# Patient Record
Sex: Male | Born: 1955 | Race: Black or African American | Hispanic: No | Smoking: Never smoker
Health system: Southern US, Community
[De-identification: ages and names within clinical notes are randomized; demographics above are authoritative.]

## PROBLEM LIST (undated history)

## (undated) DIAGNOSIS — I10 Essential (primary) hypertension: Secondary | ICD-10-CM

## (undated) DIAGNOSIS — W3400XA Accidental discharge from unspecified firearms or gun, initial encounter: Secondary | ICD-10-CM

## (undated) DIAGNOSIS — S21239A Puncture wound without foreign body of unspecified back wall of thorax without penetration into thoracic cavity, initial encounter: Secondary | ICD-10-CM

## (undated) DIAGNOSIS — M069 Rheumatoid arthritis, unspecified: Secondary | ICD-10-CM

## (undated) HISTORY — PX: COLONOSCOPY: SHX174

---
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
## 1978-10-05 HISTORY — PX: NEUROPLASTY / TRANSPOSITION ULNAR NERVE AT ELBOW: SUR895

## 2000-06-06 ENCOUNTER — Emergency Department (HOSPITAL_COMMUNITY): Admission: EM | Admit: 2000-06-06 | Discharge: 2000-06-06 | Payer: Self-pay | Admitting: Emergency Medicine

## 2000-06-06 ENCOUNTER — Encounter: Payer: Self-pay | Admitting: Emergency Medicine

## 2000-06-15 ENCOUNTER — Encounter: Admission: RE | Admit: 2000-06-15 | Discharge: 2000-06-15 | Payer: Self-pay | Admitting: Hematology and Oncology

## 2000-06-21 ENCOUNTER — Encounter: Payer: Self-pay | Admitting: Internal Medicine

## 2000-06-21 ENCOUNTER — Ambulatory Visit (HOSPITAL_COMMUNITY): Admission: RE | Admit: 2000-06-21 | Discharge: 2000-06-21 | Payer: Self-pay | Admitting: Internal Medicine

## 2000-06-28 ENCOUNTER — Encounter: Admission: RE | Admit: 2000-06-28 | Discharge: 2000-06-28 | Payer: Self-pay | Admitting: Internal Medicine

## 2003-10-06 HISTORY — PX: DEBRIDEMENT LEG: SUR390

## 2007-01-22 ENCOUNTER — Inpatient Hospital Stay (HOSPITAL_COMMUNITY): Admission: AC | Admit: 2007-01-22 | Discharge: 2007-01-24 | Payer: Self-pay

## 2007-03-31 ENCOUNTER — Ambulatory Visit (HOSPITAL_COMMUNITY): Admission: RE | Admit: 2007-03-31 | Discharge: 2007-03-31 | Payer: Self-pay | Admitting: Orthopedic Surgery

## 2008-05-29 ENCOUNTER — Emergency Department (HOSPITAL_COMMUNITY): Admission: EM | Admit: 2008-05-29 | Discharge: 2008-05-29 | Payer: Self-pay | Admitting: Emergency Medicine

## 2011-04-10 ENCOUNTER — Emergency Department (HOSPITAL_COMMUNITY)
Admission: EM | Admit: 2011-04-10 | Discharge: 2011-04-10 | Disposition: A | Discharge status: Home / Self Care | Payer: Self-pay | Attending: Emergency Medicine | Admitting: Emergency Medicine

## 2011-04-10 DIAGNOSIS — R21 Rash and other nonspecific skin eruption: Secondary | ICD-10-CM | POA: Insufficient documentation

## 2011-04-10 DIAGNOSIS — L299 Pruritus, unspecified: Secondary | ICD-10-CM | POA: Insufficient documentation

## 2011-04-20 ENCOUNTER — Emergency Department (HOSPITAL_COMMUNITY)
Admission: EM | Admit: 2011-04-20 | Discharge: 2011-04-20 | Disposition: A | Discharge status: Home / Self Care | Payer: Self-pay | Attending: Emergency Medicine | Admitting: Emergency Medicine

## 2011-04-20 DIAGNOSIS — L298 Other pruritus: Secondary | ICD-10-CM | POA: Insufficient documentation

## 2011-04-20 DIAGNOSIS — R21 Rash and other nonspecific skin eruption: Secondary | ICD-10-CM | POA: Insufficient documentation

## 2011-04-20 DIAGNOSIS — L2989 Other pruritus: Secondary | ICD-10-CM | POA: Insufficient documentation

## 2011-05-14 ENCOUNTER — Emergency Department (HOSPITAL_COMMUNITY): Payer: Self-pay

## 2011-05-14 ENCOUNTER — Inpatient Hospital Stay (HOSPITAL_COMMUNITY)
Admission: EM | Admit: 2011-05-14 | Discharge: 2011-05-18 | DRG: 501 | Disposition: A | Discharge status: Home / Self Care | Payer: Self-pay | Attending: Internal Medicine | Admitting: Internal Medicine

## 2011-05-14 DIAGNOSIS — M67919 Unspecified disorder of synovium and tendon, unspecified shoulder: Principal | ICD-10-CM | POA: Diagnosis present

## 2011-05-14 DIAGNOSIS — IMO0002 Reserved for concepts with insufficient information to code with codable children: Secondary | ICD-10-CM | POA: Diagnosis present

## 2011-05-14 DIAGNOSIS — I10 Essential (primary) hypertension: Secondary | ICD-10-CM | POA: Diagnosis present

## 2011-05-14 DIAGNOSIS — M719 Bursopathy, unspecified: Principal | ICD-10-CM | POA: Diagnosis present

## 2011-05-14 DIAGNOSIS — K029 Dental caries, unspecified: Secondary | ICD-10-CM | POA: Diagnosis present

## 2011-05-14 DIAGNOSIS — D649 Anemia, unspecified: Secondary | ICD-10-CM | POA: Diagnosis not present

## 2011-05-14 DIAGNOSIS — M24619 Ankylosis, unspecified shoulder: Secondary | ICD-10-CM | POA: Diagnosis present

## 2011-05-14 DIAGNOSIS — A4901 Methicillin susceptible Staphylococcus aureus infection, unspecified site: Secondary | ICD-10-CM | POA: Diagnosis present

## 2011-05-14 LAB — DIFFERENTIAL
Basophils Relative: 1 % (ref 0–1)
Monocytes Absolute: 0.6 10*3/uL (ref 0.1–1.0)
Monocytes Relative: 8 % (ref 3–12)
Neutro Abs: 4.8 10*3/uL (ref 1.7–7.7)

## 2011-05-14 LAB — CBC
HCT: 38.9 % — ABNORMAL LOW (ref 39.0–52.0)
Hemoglobin: 12.4 g/dL — ABNORMAL LOW (ref 13.0–17.0)
MCH: 29 pg (ref 26.0–34.0)
MCHC: 31.9 g/dL (ref 30.0–36.0)

## 2011-05-14 LAB — SEDIMENTATION RATE: Sed Rate: 40 mm/hr — ABNORMAL HIGH (ref 0–16)

## 2011-05-14 LAB — POCT I-STAT, CHEM 8
BUN: 8 mg/dL (ref 6–23)
Creatinine, Ser: 0.9 mg/dL (ref 0.50–1.35)
Sodium: 141 mEq/L (ref 135–145)
TCO2: 28 mmol/L (ref 0–100)

## 2011-05-15 ENCOUNTER — Observation Stay (HOSPITAL_COMMUNITY): Payer: Self-pay

## 2011-05-15 LAB — COMPREHENSIVE METABOLIC PANEL
Albumin: 2.4 g/dL — ABNORMAL LOW (ref 3.5–5.2)
BUN: 8 mg/dL (ref 6–23)
Calcium: 8.1 mg/dL — ABNORMAL LOW (ref 8.4–10.5)
Creatinine, Ser: 0.76 mg/dL (ref 0.50–1.35)
Total Protein: 6 g/dL (ref 6.0–8.3)

## 2011-05-15 LAB — CBC
HCT: 36.5 % — ABNORMAL LOW (ref 39.0–52.0)
MCH: 29.4 pg (ref 26.0–34.0)
MCHC: 32.3 g/dL (ref 30.0–36.0)
MCV: 91 fL (ref 78.0–100.0)
RDW: 12.6 % (ref 11.5–15.5)

## 2011-05-15 MED ORDER — GADOBENATE DIMEGLUMINE 529 MG/ML IV SOLN
15.0000 mL | Freq: Once | INTRAVENOUS | Status: AC
  Administered 2011-05-15: 15 mL via INTRAVENOUS

## 2011-05-16 LAB — BASIC METABOLIC PANEL
Calcium: 8.3 mg/dL — ABNORMAL LOW (ref 8.4–10.5)
Creatinine, Ser: 0.87 mg/dL (ref 0.50–1.35)
GFR calc non Af Amer: >60 mL/min (ref >60)
Glucose, Bld: 139 mg/dL — ABNORMAL HIGH (ref 70–99)
Sodium: 137 mEq/L (ref 135–145)

## 2011-05-16 LAB — CBC
MCH: 29.9 pg (ref 26.0–34.0)
MCHC: 32.9 g/dL (ref 30.0–36.0)
MCV: 90.8 fL (ref 78.0–100.0)
Platelets: 310 10*3/uL (ref 150–400)

## 2011-05-17 LAB — BASIC METABOLIC PANEL
CO2: 27 mEq/L (ref 19–32)
Calcium: 8.7 mg/dL (ref 8.4–10.5)
Chloride: 105 mEq/L (ref 96–112)
Glucose, Bld: 84 mg/dL (ref 70–99)
Sodium: 139 mEq/L (ref 135–145)

## 2011-05-18 LAB — BODY FLUID CULTURE
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
## 2011-05-21 LAB — CULTURE, BLOOD (ROUTINE X 2)
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
## 2017-03-10 ENCOUNTER — Encounter (HOSPITAL_COMMUNITY): Payer: Self-pay | Admitting: Emergency Medicine

## 2017-03-10 ENCOUNTER — Emergency Department (HOSPITAL_COMMUNITY)
Admission: EM | Admit: 2017-03-10 | Discharge: 2017-03-10 | Disposition: A | Discharge status: Home / Self Care | Payer: Self-pay | Attending: Emergency Medicine | Admitting: Emergency Medicine

## 2017-03-10 DIAGNOSIS — L509 Urticaria, unspecified: Secondary | ICD-10-CM | POA: Insufficient documentation

## 2017-03-10 DIAGNOSIS — T7840XA Allergy, unspecified, initial encounter: Secondary | ICD-10-CM | POA: Insufficient documentation

## 2017-03-10 MED ORDER — SODIUM CHLORIDE 0.9 % IV SOLN
Freq: Once | INTRAVENOUS | Status: AC
  Administered 2017-03-10: 07:00:00 via INTRAVENOUS

## 2017-03-10 MED ORDER — FAMOTIDINE IN NACL 20-0.9 MG/50ML-% IV SOLN
20.0000 mg | Freq: Once | INTRAVENOUS | Status: AC
  Administered 2017-03-10: 20 mg via INTRAVENOUS
  Filled 2017-03-10: qty 50

## 2017-03-10 MED ORDER — DIPHENHYDRAMINE HCL 25 MG PO CAPS: 25.0000 mg | ORAL_CAPSULE | Freq: Four times a day (QID) | ORAL | 0 refills | Status: DC | PRN

## 2017-03-10 MED ORDER — DIPHENHYDRAMINE HCL 50 MG/ML IJ SOLN
25.0000 mg | Freq: Once | INTRAMUSCULAR | Status: AC
Start: 2017-03-10 — End: 2017-03-10
  Administered 2017-03-10: 25 mg via INTRAVENOUS
  Filled 2017-03-10: qty 1

## 2017-03-10 MED ORDER — METHYLPREDNISOLONE SODIUM SUCC 125 MG IJ SOLR
125.0000 mg | Freq: Once | INTRAMUSCULAR | Status: AC
Start: 2017-03-10 — End: 2017-03-10
  Administered 2017-03-10: 125 mg via INTRAVENOUS
  Filled 2017-03-10: qty 2

## 2017-03-10 NOTE — ED Provider Notes (Signed)
[facility] Provider Note   CSN: [id number] Arrival date & time: 03/10/17  0636     History   Chief Complaint Chief Complaint  Patient presents with  . Allergic Reaction    HPI Jimmy Peters is a 61 y.o. male.  HPI 60 y.o. male, presents to the Emergency Department today due to rash and itching last night. Pt states that he was out gardening around 7pm in the leaves when he started to have hives develop. Notes rash on BUE, Torso, BLE spared. Denies throat closure. No difficulty breathing. No pain currently. No fevers. No URI symptoms. No CP/SOB/ABD pain. No headaches. Did not take anything PTA. No meds yesterday. No other symptoms noted.   No past medical history on file.  There are no active problems to display for this patient.   No past surgical history on file.     Home Medications    Prior to Admission medications   Not on File    Family History No family history on file.  Social History Social History  Substance Use Topics  . Smoking status: Not on file  . Smokeless tobacco: Not on file  . Alcohol use Not on file     Allergies   Patient has no allergy information on record.   Review of Systems Review of Systems ROS reviewed and all are negative for acute change except as noted in the HPI.  Physical Exam Updated Vital Signs BP 135/86   Pulse (!) 43   Temp 97.5 F (36.4 C) (Oral)   Resp 16   Ht 5\' 8"  (1.727 m)   Wt 70.8 kg (156 lb)   SpO2 99%   BMI 23.72 kg/m   Physical Exam  Constitutional: He is oriented to person, place, and time. Vital signs are normal. He appears well-developed and well-nourished.  HENT:  Head: Normocephalic and atraumatic.  Right Ear: Hearing normal.  Left Ear: Hearing normal.  Pt phonating well. No sign of airway compromise   Eyes: Conjunctivae and EOM are normal. Pupils are equal, round, and reactive to light.  Neck: Normal range of motion. Neck supple.  Cardiovascular: Normal rate, regular rhythm,  normal heart sounds and intact distal pulses.   Pulmonary/Chest: Effort normal and breath sounds normal.  Abdominal: There is no tenderness.  Neurological: He is alert and oriented to person, place, and time.  Skin: Skin is warm and dry.  Diffuse urticarial rash noted BUE. Hives noted. No signs of infection.   Psychiatric: He has a normal mood and affect. His speech is normal and behavior is normal. Thought content normal.  Nursing note and vitals reviewed.  ED Treatments / Results  Labs (all labs ordered are listed, but only abnormal results are displayed) Labs Reviewed - No data to display  EKG  EKG Interpretation None       Radiology No results found.  Procedures Procedures (including critical care time)  Medications Ordered in ED Medications  methylPREDNISolone sodium succinate (SOLU-MEDROL) 125 mg/2 mL injection 125 mg (125 mg Intravenous Given 03/10/17 0657)  diphenhydrAMINE (BENADRYL) injection 25 mg (25 mg Intravenous Given 03/10/17 0656)  famotidine (PEPCID) IVPB 20 mg premix (0 mg Intravenous Stopped 03/10/17 0730)  0.9 %  sodium chloride infusion ( Intravenous New Bag/Given 03/10/17 05/10/17)     Initial Impression / Assessment and Plan / ED Course  I have reviewed the triage vital signs and the nursing notes.  Pertinent labs & imaging results that were available during my care of the patient were  reviewed by me and considered in my medical decision making (see chart for details).  Final Clinical Impressions(s) / ED Diagnoses     {I have reviewed the relevant previous healthcare records.  {I obtained HPI from historian.   ED Course:  Assessment: Pt is a 61 y.o. male who presents with allergic reaction last night. Diffuse urticarial rash wit hives noted. No anaphylaxis. No airway compromise. On exam, pt in NAD. Nontoxic/nonseptic appearing. VSS. Afebrile. Lungs CTA. Heart RRR. Abdomen nontender soft. Hives noted BUE. No airway compromise. Posterior oropharynx  unremarkable.. Given solumedrol, benadryl, famotidine with significant relief of symptoms in ED. Plan is to DC home with [place] follow up with PCP. At time of discharge, Patient is in no acute distress. Vital Signs are stable. Patient is able to ambulate. Patient able to tolerate PO.   Disposition/Plan:  DC Home Additional Verbal discharge instructions given and discussed with patient.  Pt Instructed to f/u with PCP in the next week for evaluation and treatment of symptoms. Return precautions given Pt acknowledges and agrees with plan  Supervising Physician Gilda Crease, *  Final diagnoses:  Allergic reaction, initial encounter  Hives    New Prescriptions New Prescriptions   No medications on file     Audry Pili, Cordelia Poche 03/10/17 9675    Gilda Crease, MD 03/11/17 [phone]

## 2017-03-10 NOTE — Discharge Instructions (Signed)
Please read and follow all provided instructions.  Your diagnoses today include:  1. Allergic reaction, initial encounter   2. Hives    Tests performed today include: Vital signs. See below for your results today.   Medications prescribed:  Take as prescribed   Home care instructions:  Follow any educational materials contained in this packet.  Follow-up instructions: Please follow-up with your primary care provider for further evaluation of symptoms and treatment   Return instructions:  Please return to the Emergency Department if you do not get better, if you get worse, or new symptoms OR  - Fever (temperature greater than 101.33F)  - Bleeding that does not stop with holding pressure to the area    -Severe pain (please note that you may be more sore the day after your accident)  - Chest Pain  - Difficulty breathing  - Severe nausea or vomiting  - Inability to tolerate food and liquids  - Passing out  - Skin becoming red around your wounds  - Change in mental status (confusion or lethargy)  - New numbness or weakness    Please return if you have any other emergent concerns.  Additional Information:  Your vital signs today were: BP 135/86    Pulse (!) 43    Temp 97.5 F (36.4 C) (Oral)    Resp 16    Ht 5\' 8"  (1.727 m)    Wt 70.8 kg (156 lb)    SpO2 99%    BMI 23.72 kg/m  If your blood pressure (BP) was elevated above 135/85 this visit, please have this repeated by your doctor within one month. ---------------

## 2017-03-10 NOTE — ED Triage Notes (Signed)
Patient states he was working in leaves in the yard last night, started having itching and burning last night.  Took a shower, but still having the symptoms.  Patient now with hives.  Patient denies any dyspnea.

## 2017-03-11 ENCOUNTER — Encounter (HOSPITAL_COMMUNITY): Payer: Self-pay | Admitting: Emergency Medicine

## 2017-03-11 ENCOUNTER — Emergency Department (HOSPITAL_COMMUNITY)
Admission: EM | Admit: 2017-03-11 | Discharge: 2017-03-11 | Disposition: A | Discharge status: Home / Self Care | Payer: Self-pay | Attending: Emergency Medicine | Admitting: Emergency Medicine

## 2017-03-11 DIAGNOSIS — L509 Urticaria, unspecified: Secondary | ICD-10-CM | POA: Insufficient documentation

## 2017-03-11 MED ORDER — PREDNISONE 20 MG PO TABS: ORAL_TABLET | ORAL | 0 refills | Status: DC

## 2017-03-11 MED ORDER — DIPHENHYDRAMINE HCL 50 MG/ML IJ SOLN
50.0000 mg | Freq: Once | INTRAMUSCULAR | Status: AC
  Administered 2017-03-11: 50 mg via INTRAVENOUS
  Filled 2017-03-11: qty 1

## 2017-03-11 MED ORDER — FAMOTIDINE IN NACL 20-0.9 MG/50ML-% IV SOLN
20.0000 mg | Freq: Once | INTRAVENOUS | Status: AC
  Administered 2017-03-11: 20 mg via INTRAVENOUS
  Filled 2017-03-11: qty 50

## 2017-03-11 MED ORDER — METHYLPREDNISOLONE SODIUM SUCC 125 MG IJ SOLR
125.0000 mg | Freq: Once | INTRAMUSCULAR | Status: AC
  Administered 2017-03-11: 125 mg via INTRAVENOUS
  Filled 2017-03-11: qty 2

## 2017-03-11 NOTE — ED Provider Notes (Signed)
[facility] Provider Note   CSN: [id number] Arrival date & time: 03/11/17  0127     History   Chief Complaint Chief Complaint  Patient presents with  . Urticaria    HPI Jimmy Peters is a 61 y.o. male.  Patient presents to the emergency department for evaluation of hives and itching. Patient was seen in the ER yesterday for similar. He reports that he was given medications in the ER that resolved the hives, but after going home hives came back. He took over-the-counter Benadryl without relief. Patient reports diffuse itching. He denies tongue swelling, throat swelling, difficulty swallowing, shortness of breath. He is not aware of what caused the reaction, has never had a similar reaction.      History reviewed. No pertinent past medical history.  There are no active problems to display for this patient.   History reviewed. No pertinent surgical history.     Home Medications    Prior to Admission medications   Medication Sig Start Date End Date Taking? Authorizing Provider  diphenhydrAMINE (BENADRYL) 25 mg capsule Take 1 capsule (25 mg total) by mouth every 6 (six) hours as needed. 03/10/17   Audry Pili, PA-C    Family History No family history on file.  Social History Social History  Substance Use Topics  . Smoking status: Never Smoker  . Smokeless tobacco: Never Used  . Alcohol use No     Allergies   Patient has no known allergies.   Review of Systems Review of Systems  Skin: Positive for rash.  All other systems reviewed and are negative.    Physical Exam Updated Vital Signs BP (!) 143/95 (BP Location: Left Arm)   Pulse 60   Temp 98.2 F (36.8 C) (Oral)   Resp 18   SpO2 99%   Physical Exam  Constitutional: He is oriented to person, place, and time. He appears well-developed and well-nourished. No distress.  HENT:  Head: Normocephalic and atraumatic.  Right Ear: Hearing normal.  Left Ear: Hearing normal.  Nose: Nose normal.    Mouth/Throat: Oropharynx is clear and moist and mucous membranes are normal.  Eyes: Conjunctivae and EOM are normal. Pupils are equal, round, and reactive to light.  Neck: Normal range of motion. Neck supple.  Cardiovascular: Regular rhythm, S1 normal and S2 normal.  Exam reveals no gallop and no friction rub.   No murmur heard. Pulmonary/Chest: Effort normal and breath sounds normal. No respiratory distress. He exhibits no tenderness.  Abdominal: Soft. Normal appearance and bowel sounds are normal. There is no hepatosplenomegaly. There is no tenderness. There is no rebound, no guarding, no tenderness at McBurney's point and negative Murphy's sign. No hernia.  Musculoskeletal: Normal range of motion.  Neurological: He is alert and oriented to person, place, and time. He has normal strength. No cranial nerve deficit or sensory deficit. Coordination normal. GCS eye subscore is 4. GCS verbal subscore is 5. GCS motor subscore is 6.  Skin: Skin is warm, dry and intact. Rash (diffuse urticaria) noted. No cyanosis.  Psychiatric: He has a normal mood and affect. His speech is normal and behavior is normal. Thought content normal.  Nursing note and vitals reviewed.    ED Treatments / Results  Labs (all labs ordered are listed, but only abnormal results are displayed) Labs Reviewed - No data to display  EKG  EKG Interpretation None       Radiology No results found.  Procedures Procedures (including critical care time)  Medications Ordered in ED  Medications  famotidine (PEPCID) IVPB 20 mg premix (20 mg Intravenous New Bag/Given 03/11/17 0526)  diphenhydrAMINE (BENADRYL) injection 50 mg (50 mg Intravenous Given 03/11/17 0515)  methylPREDNISolone sodium succinate (SOLU-MEDROL) 125 mg/2 mL injection 125 mg (125 mg Intravenous Given 03/11/17 0518)     Initial Impression / Assessment and Plan / ED Course  I have reviewed the triage vital signs and the nursing notes.  Pertinent labs & imaging  results that were available during my care of the patient were reviewed by me and considered in my medical decision making (see chart for details).     Patient presents with recurrent hives. Patient treated in the ER with improvement yesterday but has had recurrence of the hives without signs of systemic allergic response or anaphylaxis. Patient treated with Benadryl, Pepcid, Solu-Medrol here in the ER and will continue prednisone taper and antihistamines at home.  Final Clinical Impressions(s) / ED Diagnoses   Final diagnoses:  Hives    New Prescriptions New Prescriptions   No medications on file     Gilda Crease, MD 03/11/17 [phone]

## 2017-03-11 NOTE — ED Triage Notes (Signed)
Pt to ED for itching and hives, was seen Wednesday morning for exact same and given benadryl as well as a prescription for it, which he states he couldn't get filled because he was working. Pt does state that he tried OTC benadryl, which didn't do anything for him. Last time he took it was about 6 hours ago. Pt reports he doesn't know what he's allergic to - states nothing. He also reports that the hives went away after treated in the ED, but they came right back. Pt denies any other symptoms. Resp e/u.

## 2017-03-11 NOTE — Discharge Instructions (Signed)
Take benadryl 2 tablets every 6 hours for 2 days.

## 2019-01-28 ENCOUNTER — Other Ambulatory Visit: Payer: Self-pay

## 2019-01-28 ENCOUNTER — Emergency Department (HOSPITAL_COMMUNITY): Payer: Self-pay

## 2019-01-28 ENCOUNTER — Encounter (HOSPITAL_COMMUNITY): Payer: Self-pay | Admitting: Emergency Medicine

## 2019-01-28 ENCOUNTER — Emergency Department (HOSPITAL_COMMUNITY)
Admission: EM | Admit: 2019-01-28 | Discharge: 2019-01-28 | Disposition: A | Discharge status: Home / Self Care | Payer: Self-pay | Attending: Emergency Medicine | Admitting: Emergency Medicine

## 2019-01-28 DIAGNOSIS — R519 Headache, unspecified: Secondary | ICD-10-CM

## 2019-01-28 DIAGNOSIS — I10 Essential (primary) hypertension: Secondary | ICD-10-CM | POA: Insufficient documentation

## 2019-01-28 DIAGNOSIS — R51 Headache: Secondary | ICD-10-CM | POA: Insufficient documentation

## 2019-01-28 HISTORY — DX: Essential (primary) hypertension: I10

## 2019-01-28 LAB — CBC
HCT: 44.9 % (ref 39.0–52.0)
Hemoglobin: 14.3 g/dL (ref 13.0–17.0)
MCH: 30.4 pg (ref 26.0–34.0)
MCHC: 31.8 g/dL (ref 30.0–36.0)
MCV: 95.3 fL (ref 80.0–100.0)
Platelets: 240 10*3/uL (ref 150–400)
RBC: 4.71 MIL/uL (ref 4.22–5.81)
RDW: 12 % (ref 11.5–15.5)
WBC: 6.2 10*3/uL (ref 4.0–10.5)
nRBC: 0 % (ref 0.0–0.2)

## 2019-01-28 LAB — BASIC METABOLIC PANEL
Anion gap: 9 (ref 5–15)
BUN: 13 mg/dL (ref 6–20)
CO2: 27 mmol/L (ref 22–32)
Calcium: 9.1 mg/dL (ref 8.9–10.3)
Chloride: 104 mmol/L (ref 98–111)
Creatinine, Ser: 0.9 mg/dL (ref 0.61–1.24)
GFR calc Af Amer: >60 mL/min (ref >60)
GFR calc non Af Amer: >60 mL/min (ref >60)
Glucose, Bld: 107 mg/dL — ABNORMAL HIGH (ref 70–99)
Potassium: 3.9 mmol/L (ref 3.5–5.1)
Sodium: 140 mmol/L (ref 135–145)

## 2019-01-28 MED ORDER — METOCLOPRAMIDE HCL 5 MG/ML IJ SOLN
10.0000 mg | Freq: Once | INTRAMUSCULAR | Status: AC
  Administered 2019-01-28: 22:00:00 10 mg via INTRAVENOUS
  Filled 2019-01-28: qty 2

## 2019-01-28 MED ORDER — KETOROLAC TROMETHAMINE 15 MG/ML IJ SOLN
15.0000 mg | Freq: Once | INTRAMUSCULAR | Status: AC
  Administered 2019-01-28: 22:00:00 15 mg via INTRAVENOUS
  Filled 2019-01-28: qty 1

## 2019-01-28 NOTE — Discharge Instructions (Addendum)
You have been seen today for a headache. Please read and follow all provided instructions.   1. Medications: continue your usual home medications 2. Treatment: rest, drink plenty of fluids 3. Follow Up: Please follow up with your primary doctor in 2 days for discussion of your diagnoses and further evaluation after today's visit; if you do not have a primary care doctor use the resource guide provided to find one; Please return to the ER for any new or worsening symptoms. Please obtain all of your results from medical records or have your doctors office obtain the results - share them with your doctor - you should be seen at your doctors office. Call today to arrange your follow up.   Take medications as prescribed. Please review all of the medicines and only take them if you do not have an allergy to them. Return to the emergency room for worsening condition or new concerning symptoms. Follow up with your regular doctor. If you don't have a regular doctor use one of the numbers below to establish a primary care doctor.  It is also a possibility that you have an allergic reaction to any of the medicines that you have been prescribed - Everybody reacts differently to medications and while MOST people have no trouble with most medicines, you may have a reaction such as nausea, vomiting, rash, swelling, shortness of breath. If this is the case, please stop taking the medicine immediately and contact your physician.  ?  You should return to the ER if you develop severe or worsening symptoms.   Emergency Department Resource Guide 1) Find a Doctor and Pay Out of Pocket Although you won't have to find out who is covered by your insurance plan, it is a good idea to ask around and get recommendations. You will then need to call the office and see if the doctor you have chosen will accept you as a new patient and what types of options they offer for patients who are self-pay. Some doctors offer discounts or will  set up payment plans for their patients who do not have insurance, but you will need to ask so you aren't surprised when you get to your appointment.  2) Contact Your Local Health Department Not all health departments have doctors that can see patients for sick visits, but many do, so it is worth a call to see if yours does. If you don't know where your local health department is, you can check in your phone book. The CDC also has a tool to help you locate your state's health department, and many state websites also have listings of all of their local health departments.  3) Find a Walk-in Clinic If your illness is not likely to be very severe or complicated, you may want to try a walk in clinic. These are popping up all over the country in pharmacies, drugstores, and shopping centers. They're usually staffed by nurse practitioners or physician assistants that have been trained to treat common illnesses and complaints. They're usually fairly quick and inexpensive. However, if you have serious medical issues or chronic medical problems, these are probably not your best option.  No Primary Care Doctor: Call Health Connect at  [phone] - they can help you locate a primary care doctor that  accepts your insurance, provides certain services, etc. Physician Referral Service- [phone]  Chronic Pain Problems: Organization         Address  Phone   Notes  [facility]  (  [phone] Patients need to be referred by their primary care doctor.   Medication Assistance: Organization         Address  Phone   Notes  [facility] Medication [facility] [address] [place], [place] [address] [phone] --Must be a resident of [facility] -- Must have NO insurance coverage whatsoever (no Medicaid/ Medicare, etc.) -- The pt. MUST have a primary care doctor that directs their care regularly and follows them in the community   [facility]  [phone]    [employer]  [phone]    Agencies that provide inexpensive medical care: Organization         Address  Phone   Notes  [place]  [phone]   [facility]    [phone]   [facility] [place], [place] [address] [phone]   [place] [address] [place] [phone]   [facility]    [phone]   [facility]    [phone]   [place] and [place] [address], [place] Phone:  [phone], Fax:  [phone] Hours of Operation:  9 am - 6 pm, M-F.  Also accepts Medicaid/Medicare and self-pay.  [facility] for [place] [address], [address], [place] Phone: [phone], Fax: [phone]. Hours of Operation:  8:30 am - 5:30 pm, M-F.  Also accepts Medicaid and self-pay.  [facility] [place] [address], [place] Phone: [phone]   [place], [place], [place] [phone], Ext. [phone] Mondays & Thursdays: 7-9 AM.  First 15 patients are seen on a first come, first serve basis.    [place] Providers:  Organization         Address  Phone   Notes  [facility] [address], [place] [phone] Also accepts self-pay patients.  [facility] [address], [place]  [phone]   [place], [address], [place] [phone]   [facility] [address] [place] [phone]   Lucianne Lei [address], [place]   [phone] Only accepts [place] [facility] [place] patients after they have their name applied to their card.   Self-Pay (no insurance) in [facility]:  Organization         Address  Phone   Notes  Sickle Cell Patients, [facility] [place] [phone]   [facility] [place] [phone]   [facility] [place]  [place], [place],  [phone]   [facility]/Dr. Osei-Bonsu  [address] [place] or [address], [place] [phone] Phone number for both [place] and [place] locations is the same.  [facility] [address] [place] [phone]   [facility] [address] [place] or [address] [phone]   [facility] [address] [place] [phone], phone; [phone], fax Sees patients 1st and 3rd Saturday of every month.  Must not qualify for public or private insurance (i.e. Medicaid, Medicare, [place] Health Choice, Veterans' Benefits)  Household income should be no more than 200% of the poverty level The clinic cannot treat you if you are pregnant or think you are pregnant  Sexually transmitted diseases are not treated at the clinic.

## 2019-01-28 NOTE — ED Triage Notes (Addendum)
Pt. BIB EMS escorted by detention officer from [facility]al facility c/o HTN, Headache of 10 on scale 0-10 described as "throbbing", and  left sided weakness and numbness starting yesterday at 4 am. Pt. C/o blurry vision yesterday but none present. Pt A&O x4.  HX. Of HTN   EMS VS BP 181/96 HR 68 SpO2 96% RA

## 2019-01-28 NOTE — ED Provider Notes (Addendum)
[facility] EMERGENCY DEPARTMENT Provider Note   CSN: [id number] Arrival date & time: 01/28/19  2115    History   Chief Complaint No chief complaint on file. Headache, weakness  HPI Jimmy Peters is a 63 y.o. male with a PMH of HTN presenting with a constant right sided throbbing headache onset yesterday. Patient arrived via EMS by detention officer from correctional facility. Patient reports left sided upper extremity weakness and numbness onset 4:30am today. Patient states nothing makes symptoms better or worse. Patient denies taking any medications for his symptoms. Patient reports blurry vision at 4pm today, but states symptoms have resolved. Patient states he takes HCTZ for his blood pressure and denies missing any doses. Patient denies fever, cough, congestion, nausea, vomiting, abdominal pain, chest pain, or shortness of breath. Patient denies sick contacts, known exposure to COVID-19, or recent travel.      HPI  Past Medical History:  Diagnosis Date  . Hypertension     There are no active problems to display for this patient.   History reviewed. No pertinent surgical history.      Home Medications    Prior to Admission medications   Medication Sig Start Date End Date Taking? Authorizing Provider  diphenhydrAMINE (BENADRYL) 25 mg capsule Take 1 capsule (25 mg total) by mouth every 6 (six) hours as needed. 03/10/17   Audry Pili, PA-C  predniSONE (DELTASONE) 20 MG tablet 3 tabs po daily x 3 days, then 2 tabs x 3 days, then 1.5 tabs x 3 days, then 1 tab x 3 days, then 0.5 tabs x 3 days 03/11/17   Gilda Crease, MD    Family History No family history on file.  Social History Social History   Tobacco Use  . Smoking status: Never Smoker  . Smokeless tobacco: Never Used  Substance Use Topics  . Alcohol use: No  . Drug use: Yes     Allergies   Patient has no known allergies.   Review of Systems Review of Systems  Constitutional:  Negative for chills, diaphoresis and fever.  Eyes: Positive for visual disturbance.  Respiratory: Negative for cough and shortness of breath.   Cardiovascular: Negative for chest pain.  Gastrointestinal: Negative for abdominal pain, nausea and vomiting.  Endocrine: Negative for cold intolerance and heat intolerance.  Musculoskeletal: Negative for gait problem, neck pain and neck stiffness.  Skin: Negative for rash.  Allergic/Immunologic: Negative for immunocompromised state.  Neurological: Positive for weakness, numbness and headaches. Negative for dizziness and light-headedness.  Hematological: Negative for adenopathy.    Physical Exam Updated Vital Signs BP (!) 153/94 (BP Location: Right Arm)   Pulse (!) 55   Temp 98.2 F (36.8 C) (Oral)   Resp (!) 24   Ht 5\' 8"  (1.727 m)   Wt 73.9 kg   SpO2 99%   BMI 24.78 kg/m   Physical Exam Vitals signs and nursing note reviewed.  Constitutional:      General: He is not in acute distress.    Appearance: He is well-developed. He is not diaphoretic.  HENT:     Head: Normocephalic and atraumatic.     Nose: Nose normal. No congestion or rhinorrhea.     Mouth/Throat:     Mouth: Mucous membranes are moist.     Pharynx: No posterior oropharyngeal erythema.  Eyes:     Extraocular Movements: Extraocular movements intact.     Conjunctiva/sclera: Conjunctivae normal.     Pupils: Pupils are equal, round, and reactive to light.  Neck:     Musculoskeletal: Normal range of motion.  Cardiovascular:     Rate and Rhythm: Normal rate and regular rhythm.     Heart sounds: Normal heart sounds. No murmur. No friction rub. No gallop.   Pulmonary:     Effort: Pulmonary effort is normal. No respiratory distress.     Breath sounds: Normal breath sounds. No wheezing or rales.  Abdominal:     Palpations: Abdomen is soft.     Tenderness: There is no abdominal tenderness.  Musculoskeletal: Normal range of motion.  Skin:    General: Skin is warm.      Findings: No erythema or rash.  Neurological:     Mental Status: He is alert.    Mental Status:  Alert, oriented, thought content appropriate, able to give a coherent history. Speech fluent without evidence of aphasia. Able to follow 2 step commands without difficulty.  Cranial Nerves:  II:  Peripheral visual fields grossly normal, pupils equal, round, reactive to light III,IV, VI: ptosis not present, extra-ocular motions intact bilaterally  V,VII: smile symmetric, facial light touch sensation equal VIII: hearing grossly normal to voice  IX,X: symmetric elevation of soft palate, uvula elevates symmetrically  XI: bilateral shoulder shrug symmetric and strong XII: midline tongue extension without fassiculations Motor:  Normal tone. 5/5 in upper and lower extremities bilaterally including strong and equal grip strength and dorsiflexion/plantar flexion. No weakness noted. Sensory: Decreased sensation on left arm when compared to right arm. Normal sensation  Deep Tendon Reflexes: 2+ and symmetric in the biceps and patella Cerebellar: normal finger-to-nose with bilateral upper extremities CV: distal pulses palpable throughout   ED Treatments / Results  Labs (all labs ordered are listed, but only abnormal results are displayed) Labs Reviewed  BASIC METABOLIC PANEL - Abnormal; Notable for the following components:      Result Value   Glucose, Bld 107 (*)    All other components within normal limits  CBC    EKG EKG Interpretation  Date/Time:  Saturday January 28 2019 21:22:32 EDT Ventricular Rate:  59 PR Interval:    QRS Duration: 89 QT Interval:  424 QTC Calculation: 420 R Axis:   77 Text Interpretation:  Sinus rhythm Minimal ST elevation, anterior leads No prior ECG for comparison,  No STEMI Confirmed by Theda Belfastegeler, Chris ([id number]) on 01/28/2019 11:34:54 PM   Radiology Ct Head Wo Contrast  Result Date: 01/28/2019 CLINICAL DATA:  Thunderclap headache EXAM: CT HEAD WITHOUT CONTRAST  TECHNIQUE: Contiguous axial images were obtained from the base of the skull through the vertex without intravenous contrast. COMPARISON:  None. FINDINGS: Brain: There is no mass, hemorrhage or extra-axial collection. The size and configuration of the ventricles and extra-axial CSF spaces are normal. The brain parenchyma is normal, without acute or chronic infarction. Vascular: No abnormal hyperdensity of the major intracranial arteries or dural venous sinuses. No intracranial atherosclerosis. Skull: The visualized skull base, calvarium and extracranial soft tissues are normal. Sinuses/Orbits: No fluid levels or advanced mucosal thickening of the visualized paranasal sinuses. No mastoid or middle ear effusion. The orbits are normal. IMPRESSION: Normal head CT. Electronically Signed   By: Deatra RobinsonKevin  Herman M.D.   On: 01/28/2019 23:08    Procedures Procedures (including critical care time)  Medications Ordered in ED Medications  metoCLOPramide (REGLAN) injection 10 mg (10 mg Intravenous Given 01/28/19 2213)  ketorolac (TORADOL) 15 MG/ML injection 15 mg (15 mg Intravenous Given 01/28/19 2212)     Initial Impression / Assessment and Plan / ED  Course  I have reviewed the triage vital signs and the nursing notes.  Pertinent labs & imaging results that were available during my care of the patient were reviewed by me and considered in my medical decision making (see chart for details).  Clinical Course as of Jan 28 2336  Sat Jan 28, 2019  2311 Head CT is negative.  CT HEAD WO CONTRAST [AH]  2315 Patient reports headache has resolved and feels ready to go back to detention center.   [AH]    Clinical Course User Index [AH] Leretha DykesHernandez, Cashel Bellina P, PA-C      Patient presents with a headache. Pt HA treated and improved while in ED. Labs, vitals, and imaging reviewed. CT head is negative. Pt is afebrile with no focal neuro deficits, nuchal rigidity, or current change in vision. Headache resolved while in the ER.  Pt is to follow up with PCP to discuss prophylactic medication. Discussed return precautions with patient. Pt verbalizes understanding and is agreeable with plan to dc.   Findings and plan of care discussed with supervising physician Dr. Rush Landmarkegeler.  Final Clinical Impressions(s) / ED Diagnoses   Final diagnoses:  Bad headache    ED Discharge Orders    None          Leretha DykesHernandez, Beckett Hickmon P, [place] JerseyPA-C 01/28/19 2338    Tegeler, Canary Brimhristopher J, MD 01/28/19 [id number]

## 2019-01-28 NOTE — ED Notes (Signed)
Pt alert c/o  headache

## 2019-01-28 NOTE — ED Notes (Signed)
Pt returned from c-t  His headache is better at present  Deputies remain at his side and handcuffs are still in place .

## 2019-12-10 ENCOUNTER — Ambulatory Visit: Payer: Self-pay | Attending: Internal Medicine

## 2019-12-10 DIAGNOSIS — Z23 Encounter for immunization: Secondary | ICD-10-CM | POA: Insufficient documentation

## 2019-12-10 NOTE — Progress Notes (Signed)
   Covid-19 Vaccination Clinic  Name:  Jimmy Peters    MRN: [id number] DOB: 03/12/1956  12/10/2019  Jimmy Peters was observed post Covid-19 immunization for 15 minutes without incident. He was provided with Vaccine Information Sheet and instruction to access the V-Safe system.   Jimmy Peters was instructed to call 911 with any severe reactions post vaccine: Marland Kitchen Difficulty breathing  . Swelling of face and throat  . A fast heartbeat  . A bad rash all over body  . Dizziness and weakness   Immunizations Administered    Name Date Dose VIS Date Route   Pfizer COVID-19 Vaccine 12/10/2019  2:04 PM 0.3 mL 09/15/2019 Intramuscular   Manufacturer: [employer], [employer]   Lot: [id number]   NDC: [address]-1

## 2020-01-09 ENCOUNTER — Ambulatory Visit: Payer: Self-pay | Attending: Internal Medicine

## 2020-01-09 DIAGNOSIS — Z23 Encounter for immunization: Secondary | ICD-10-CM

## 2020-01-09 NOTE — Progress Notes (Signed)
   Covid-19 Vaccination Clinic  Name:  Jimmy Peters    MRN: [id number] DOB: 1955/11/20  01/09/2020  [name] was observed post Covid-19 immunization for 15 minutes without incident. He was provided with Vaccine Information Sheet and instruction to access the V-Safe system.   [name] was instructed to call 911 with any severe reactions post vaccine: Marland Kitchen Difficulty breathing  . Swelling of face and throat  . A fast heartbeat  . A bad rash all over body  . Dizziness and weakness   Immunizations Administered    Name Date Dose VIS Date Route   Pfizer COVID-19 Vaccine 01/09/2020 12:28 PM 0.3 mL 09/15/2019 Intramuscular   Manufacturer: [employer], [employer]   Lot: [id number]   NDC: [address]-0

## 2020-02-03 ENCOUNTER — Emergency Department (HOSPITAL_COMMUNITY)
Admission: EM | Admit: 2020-02-03 | Discharge: 2020-02-03 | Disposition: A | Discharge status: Home / Self Care | Payer: BLUE CROSS/BLUE SHIELD | Attending: Emergency Medicine | Admitting: Emergency Medicine

## 2020-02-03 ENCOUNTER — Emergency Department (HOSPITAL_COMMUNITY): Payer: BLUE CROSS/BLUE SHIELD

## 2020-02-03 ENCOUNTER — Other Ambulatory Visit: Payer: Self-pay

## 2020-02-03 ENCOUNTER — Encounter (HOSPITAL_COMMUNITY): Payer: Self-pay | Admitting: Emergency Medicine

## 2020-02-03 DIAGNOSIS — I10 Essential (primary) hypertension: Secondary | ICD-10-CM | POA: Diagnosis not present

## 2020-02-03 DIAGNOSIS — M25562 Pain in left knee: Secondary | ICD-10-CM | POA: Diagnosis present

## 2020-02-03 DIAGNOSIS — M069 Rheumatoid arthritis, unspecified: Secondary | ICD-10-CM | POA: Diagnosis not present

## 2020-02-03 HISTORY — DX: Rheumatoid arthritis, unspecified: M06.9

## 2020-02-03 HISTORY — DX: Puncture wound without foreign body of unspecified back wall of thorax without penetration into thoracic cavity, initial encounter: S21.239A

## 2020-02-03 MED ORDER — IBUPROFEN 600 MG PO TABS: 600.0000 mg | ORAL_TABLET | Freq: Four times a day (QID) | ORAL | 0 refills | Status: DC | PRN

## 2020-02-03 MED ORDER — IBUPROFEN 800 MG PO TABS: 800.0000 mg | ORAL_TABLET | Freq: Once | ORAL | Status: DC

## 2020-02-03 NOTE — Progress Notes (Signed)
Orthopedic Tech Progress Note Patient Details:  Jimmy Peters [birth year] [id number]  Ortho Devices Type of Ortho Device: Knee Immobilizer, Crutches Ortho Device/Splint Interventions: Ordered, Application   Post Interventions Patient Tolerated: Well   Gwendolyn Lima 02/03/2020, 3:47 PM
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
## 2020-02-03 NOTE — Discharge Instructions (Signed)
You have been evaluated for your left knee pain.  X-ray today demonstrates the screws inside your knee has been broken.  Please use crutches as well as knee immobilizer when ambulating.  Take ibuprofen as needed for pain.  Call and follow-up closely with orthopedist, Dr. Dion Saucier, next week for further management.

## 2020-03-08 ENCOUNTER — Emergency Department (HOSPITAL_COMMUNITY)
Admission: EM | Admit: 2020-03-08 | Discharge: 2020-03-09 | Discharge status: Against Medical Advice | Payer: BLUE CROSS/BLUE SHIELD

## 2020-03-08 ENCOUNTER — Other Ambulatory Visit: Payer: Self-pay

## 2020-03-08 NOTE — ED Notes (Signed)
Pt called three times to triage and did not answer.

## 2021-03-30 ENCOUNTER — Emergency Department (HOSPITAL_COMMUNITY)
Admission: EM | Admit: 2021-03-30 | Discharge: 2021-03-30 | Disposition: A | Discharge status: Home / Self Care | Payer: 59 | Attending: Emergency Medicine | Admitting: Emergency Medicine

## 2021-03-30 ENCOUNTER — Encounter (HOSPITAL_COMMUNITY): Payer: Self-pay

## 2021-03-30 ENCOUNTER — Emergency Department (HOSPITAL_COMMUNITY): Payer: 59

## 2021-03-30 ENCOUNTER — Other Ambulatory Visit: Payer: Self-pay

## 2021-03-30 DIAGNOSIS — M25521 Pain in right elbow: Secondary | ICD-10-CM | POA: Insufficient documentation

## 2021-03-30 DIAGNOSIS — S0990XA Unspecified injury of head, initial encounter: Secondary | ICD-10-CM

## 2021-03-30 DIAGNOSIS — I10 Essential (primary) hypertension: Secondary | ICD-10-CM | POA: Diagnosis not present

## 2021-03-30 NOTE — Discharge Instructions (Addendum)
At this time there does not appear to be the presence of an emergent medical condition, however there is always the potential for conditions to change. Please read and follow the below instructions.  Please return to the Emergency Department immediately for any new or worsening symptoms. Please be sure to follow up with your Primary Care Provider within one week regarding your visit today; please call their office to schedule an appointment even if you are feeling better for a follow-up visit. Please use the sling to protect your arm from further injury.  You may call the on-call orthopedic group, Dr. Marita Kansas at [facility] for further evaluation and treatment.  Please use rest ice and elevation to help with pain and swelling. Your CT scan today also showed degenerative changes of your spine which you may discuss with the orthopedic group at your follow-up visit.  They also saw pulmonary emphysema on your CT scan, please discuss that with your primary care provider.  Go to the nearest Emergency Department immediately if: You have fever or chills You cannot move the joint. Your fingers or toes tingle, become numb. or get cold and blue. You have a fever along with a joint that is red, warm, and swollen. You have: A very bad headache that is not helped by medicine. Trouble walking or weakness in your arms and legs. Clear or bloody fluid coming from your nose or ears. Changes in how you see (vision). A seizure. More confusion or more grumpy moods. Your symptoms get worse. You are sleepier than normal and have trouble staying awake. You lose your balance. The black centers of your eyes (pupils) change in size. Your speech is slurred. Your dizziness gets worse. You vomit. You have any new/concerning or worsening of symptoms   Please read the additional information packets attached to your discharge summary.  Do not take your medicine if  develop an itchy rash, swelling in your mouth or lips,  or difficulty breathing; call 911 and seek immediate emergency medical attention if this occurs.  You may review your lab tests and imaging results in their entirety on your MyChart account.  Please discuss all results of fully with your primary care provider and other specialist at your follow-up visit.  Note: Portions of this text may have been transcribed using voice recognition software. Every effort was made to ensure accuracy; however, inadvertent computerized transcription errors may still be present.
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
## 2023-05-25 ENCOUNTER — Ambulatory Visit (HOSPITAL_COMMUNITY)
Admission: EM | Admit: 2023-05-25 | Discharge: 2023-05-25 | Disposition: A | Discharge status: Home / Self Care | Payer: Medicare HMO

## 2023-05-25 ENCOUNTER — Encounter (HOSPITAL_COMMUNITY): Payer: Self-pay | Admitting: Emergency Medicine

## 2023-05-25 ENCOUNTER — Inpatient Hospital Stay (HOSPITAL_COMMUNITY)
Admission: EM | Admit: 2023-05-25 | Discharge: 2023-05-28 | DRG: 418 | Disposition: A | Discharge status: Home / Self Care | Payer: Medicare HMO | Attending: Internal Medicine | Admitting: Internal Medicine

## 2023-05-25 ENCOUNTER — Other Ambulatory Visit: Payer: Self-pay

## 2023-05-25 ENCOUNTER — Emergency Department (HOSPITAL_COMMUNITY): Payer: Medicare HMO

## 2023-05-25 ENCOUNTER — Encounter (HOSPITAL_COMMUNITY): Payer: Self-pay

## 2023-05-25 DIAGNOSIS — M069 Rheumatoid arthritis, unspecified: Secondary | ICD-10-CM | POA: Diagnosis present

## 2023-05-25 DIAGNOSIS — K8010 Calculus of gallbladder with chronic cholecystitis without obstruction: Principal | ICD-10-CM | POA: Diagnosis present

## 2023-05-25 DIAGNOSIS — Z7982 Long term (current) use of aspirin: Secondary | ICD-10-CM

## 2023-05-25 DIAGNOSIS — K229 Disease of esophagus, unspecified: Secondary | ICD-10-CM

## 2023-05-25 DIAGNOSIS — R1013 Epigastric pain: Secondary | ICD-10-CM

## 2023-05-25 DIAGNOSIS — Z23 Encounter for immunization: Secondary | ICD-10-CM

## 2023-05-25 DIAGNOSIS — R112 Nausea with vomiting, unspecified: Secondary | ICD-10-CM | POA: Diagnosis present

## 2023-05-25 DIAGNOSIS — E785 Hyperlipidemia, unspecified: Secondary | ICD-10-CM | POA: Diagnosis present

## 2023-05-25 DIAGNOSIS — R7401 Elevation of levels of liver transaminase levels: Principal | ICD-10-CM | POA: Diagnosis present

## 2023-05-25 DIAGNOSIS — D849 Immunodeficiency, unspecified: Secondary | ICD-10-CM | POA: Diagnosis present

## 2023-05-25 DIAGNOSIS — I10 Essential (primary) hypertension: Secondary | ICD-10-CM | POA: Diagnosis present

## 2023-05-25 DIAGNOSIS — Z79899 Other long term (current) drug therapy: Secondary | ICD-10-CM

## 2023-05-25 DIAGNOSIS — Z8249 Family history of ischemic heart disease and other diseases of the circulatory system: Secondary | ICD-10-CM

## 2023-05-25 LAB — CBC
HCT: 49.5 % (ref 39.0–52.0)
Hemoglobin: 15.6 g/dL (ref 13.0–17.0)
MCH: 29.1 pg (ref 26.0–34.0)
MCHC: 31.5 g/dL (ref 30.0–36.0)
MCV: 92.2 fL (ref 80.0–100.0)
Platelets: 218 10*3/uL (ref 150–400)
RBC: 5.37 MIL/uL (ref 4.22–5.81)
RDW: 12.9 % (ref 11.5–15.5)
WBC: 7 10*3/uL (ref 4.0–10.5)
nRBC: 0 % (ref 0.0–0.2)

## 2023-05-25 LAB — COMPREHENSIVE METABOLIC PANEL
ALT: 348 U/L — ABNORMAL HIGH (ref 0–44)
AST: 502 U/L — ABNORMAL HIGH (ref 15–41)
Albumin: 3.8 g/dL (ref 3.5–5.0)
Alkaline Phosphatase: 116 U/L (ref 38–126)
Anion gap: 8 (ref 5–15)
BUN: 13 mg/dL (ref 8–23)
CO2: 26 mmol/L (ref 22–32)
Calcium: 8.8 mg/dL — ABNORMAL LOW (ref 8.9–10.3)
Chloride: 104 mmol/L (ref 98–111)
Creatinine, Ser: 0.78 mg/dL (ref 0.61–1.24)
GFR, Estimated: >60 mL/min (ref >60)
Glucose, Bld: 120 mg/dL — ABNORMAL HIGH (ref 70–99)
Potassium: 3.4 mmol/L — ABNORMAL LOW (ref 3.5–5.1)
Sodium: 138 mmol/L (ref 135–145)
Total Bilirubin: 2.6 mg/dL — ABNORMAL HIGH (ref 0.3–1.2)
Total Protein: 7.9 g/dL (ref 6.5–8.1)

## 2023-05-25 LAB — LIPASE, BLOOD: Lipase: 35 U/L (ref 11–51)

## 2023-05-25 MED ORDER — ONDANSETRON HCL 4 MG/2ML IJ SOLN
4.0000 mg | Freq: Once | INTRAMUSCULAR | Status: AC
  Administered 2023-05-25: 4 mg via INTRAVENOUS
  Filled 2023-05-25: qty 2

## 2023-05-25 MED ORDER — IOHEXOL 300 MG/ML  SOLN
100.0000 mL | Freq: Once | INTRAMUSCULAR | Status: AC | PRN
  Administered 2023-05-25: 100 mL via INTRAVENOUS

## 2023-05-25 NOTE — ED Provider Notes (Signed)
[place] EMERGENCY DEPARTMENT AT [facility] Provider Note   CSN: [id number] Arrival date & time: 05/25/23  2058     History {Add pertinent medical, surgical, social history, OB history to HPI:1} Chief Complaint  Patient presents with   Abdominal Pain    Norlan Kapral is a 67 y.o. male.   Abdominal Pain   67 year old male with history of rheumatoid arthritis, hypertension, presenting to the ED with abdominal pain.  States this morning he stopped at [employer] to get a bacon egg and cheese biscuit which is his normal routine.  He states after eating it he had severe abdominal pain, initially thought he had food poisoning.  He has had some nausea but denies any vomiting.  He has had a normal bowel movement today.  States throughout the day pain seems to have shifted more to his epigastric area.  He initially went to urgent care and was sent here for further evaluation.  He denies any prior abdominal surgeries.  No meds taken prior to arrival but does request no narcotics as he is in recovery.  Home Medications Prior to Admission medications   Medication Sig Start Date End Date Taking? Authorizing Provider  aspirin EC 81 MG tablet TAKE 1 TABLET BY MOUTH ONCE DAILY FOR 30 DAYS 10/12/19   [provider]  atorvastatin (LIPITOR) 20 MG tablet Take by mouth. 10/12/19   [provider]  diphenhydrAMINE (BENADRYL) 25 mg capsule Take 1 capsule (25 mg total) by mouth every 6 (six) hours as needed. 03/10/17   Audry Pili, PA-C  hydrochlorothiazide (HYDRODIURIL) 25 MG tablet Take 1 tablet by mouth daily. 10/12/19   [provider]  ibuprofen (ADVIL) 600 MG tablet Take 1 tablet (600 mg total) by mouth every 6 (six) hours as needed. 02/03/20   Fayrene Helper, PA-C  lisinopril (ZESTRIL) 10 MG tablet Take 1 tablet by mouth daily. 10/12/19   [provider]  predniSONE (DELTASONE) 20 MG tablet 3 tabs po daily x 3 days, then 2 tabs x 3 days, then 1.5 tabs x 3  days, then 1 tab x 3 days, then 0.5 tabs x 3 days 03/11/17   Gilda Crease, MD      Allergies    Patient has no known allergies.    Review of Systems   Review of Systems  Gastrointestinal:  Positive for abdominal pain.  All other systems reviewed and are negative.   Physical Exam Updated Vital Signs BP (!) 142/92 (BP Location: Left Arm)   Pulse 91   Temp 98.6 F (37 C) (Oral)   Resp 18   Ht 5\' 9"  (1.753 m)   Wt 72.6 kg   SpO2 98%   BMI 23.63 kg/m  Physical Exam Vitals and nursing note reviewed.  Constitutional:      Appearance: He is well-developed.  HENT:     Head: Normocephalic and atraumatic.  Eyes:     Conjunctiva/sclera: Conjunctivae normal.     Pupils: Pupils are equal, round, and reactive to light.  Cardiovascular:     Rate and Rhythm: Normal rate and regular rhythm.     Heart sounds: Normal heart sounds.  Pulmonary:     Effort: Pulmonary effort is normal.     Breath sounds: Normal breath sounds.  Abdominal:     General: Bowel sounds are normal.     Palpations: Abdomen is soft.  Musculoskeletal:        General: Normal range of motion.     Cervical back:  Normal range of motion.  Skin:    General: Skin is warm and dry.  Neurological:     Mental Status: He is alert and oriented to person, place, and time.     ED Results / Procedures / Treatments   Labs (all labs ordered are listed, but only abnormal results are displayed) Labs Reviewed  COMPREHENSIVE METABOLIC PANEL - Abnormal; Notable for the following components:      Result Value   Potassium 3.4 (*)    Glucose, Bld 120 (*)    Calcium 8.8 (*)    AST 502 (*)    ALT 348 (*)    Total Bilirubin 2.6 (*)    All other components within normal limits  LIPASE, BLOOD  CBC  URINALYSIS, ROUTINE W REFLEX MICROSCOPIC  HEPATITIS PANEL, ACUTE  ACETAMINOPHEN LEVEL  ETHANOL    EKG None  Radiology No results found.  Procedures Procedures  {Document cardiac monitor, telemetry assessment  procedure when appropriate:1}  Medications Ordered in ED Medications  ondansetron (ZOFRAN) injection 4 mg (has no administration in time range)    ED Course/ Medical Decision Making/ A&P   {   Click here for ABCD2, HEART and other calculatorsREFRESH Note before signing :1}                              Medical Decision Making Amount and/or Complexity of Data Reviewed Labs: ordered. Radiology: ordered.  Risk Prescription drug management.   67 year old male here with epigastric abdominal pain since this morning.  Some nausea without vomiting.  He is afebrile and nontoxic in appearance here.  Does have some tenderness to epigastric area.  Labs were obtained from triage, no leukocytosis or anemia.  His LFTs are elevated-- 502/348, bili 2.6.  He denies alcohol use, Tylenol intake.  He has had prior hepatitis testing which was negative.  Lipase is normal.  Concern for possible choledocholithiasis, pancreatitis, etc.  Will check CT scan.  Zofran given for nausea.  Final Clinical Impression(s) / ED Diagnoses Final diagnoses:  None    Rx / DC Orders ED Discharge Orders     None

## 2023-05-25 NOTE — ED Triage Notes (Signed)
Pt states he ate a bacon and cheese biscuit from [employer] today. Started with severe abdominal pain and cramping.Denies any vomiting or diarrhea.

## 2023-05-25 NOTE — ED Provider Notes (Signed)
Patient presents today with approximately 3 hours of severe abdominal pain in his epigastric area.  He rates the pain currently as a 10 out of 10 like a sharp pain.  Reports the pain began after eating a bacon egg and cheese biscuit from [employer].  Denies vomiting or diarrhea, however is significantly nauseous.  Also endorses chills while in the waiting room today.  No change in bowel or bladder habits.  On examination, patient's abdomen is exquisitely tender to touch in the epigastric and right upper quadrant.  Vital signs are stable.  I recommended further evaluation and management given severe abdominal pain for further workup, lab work, any imaging that may be indicated in the emergency room.  Patient is in agreement to plan.  Patient is safe to transport via private vehicle.     Valentino Nose, NP 05/25/23 1250

## 2023-05-25 NOTE — ED Triage Notes (Addendum)
Patient coming to ED for evaluation of abdominal pain.  Reports pain started this morning after eating breakfast.  Went to Urgent Care and was instructed to come to ED for further workup.  Having severe epigastric abdominal pain.  + Nausea.  No fevers.  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

## 2023-05-26 ENCOUNTER — Observation Stay (HOSPITAL_COMMUNITY): Payer: Medicare HMO

## 2023-05-26 ENCOUNTER — Emergency Department (HOSPITAL_COMMUNITY): Payer: Medicare HMO

## 2023-05-26 DIAGNOSIS — R1013 Epigastric pain: Secondary | ICD-10-CM | POA: Diagnosis present

## 2023-05-26 DIAGNOSIS — R112 Nausea with vomiting, unspecified: Secondary | ICD-10-CM | POA: Diagnosis present

## 2023-05-26 DIAGNOSIS — M069 Rheumatoid arthritis, unspecified: Secondary | ICD-10-CM | POA: Diagnosis present

## 2023-05-26 DIAGNOSIS — R7401 Elevation of levels of liver transaminase levels: Principal | ICD-10-CM | POA: Diagnosis present

## 2023-05-26 DIAGNOSIS — I10 Essential (primary) hypertension: Secondary | ICD-10-CM | POA: Diagnosis not present

## 2023-05-26 DIAGNOSIS — M059 Rheumatoid arthritis with rheumatoid factor, unspecified: Secondary | ICD-10-CM

## 2023-05-26 LAB — CBC
HCT: 46 % (ref 39.0–52.0)
Hemoglobin: 14.6 g/dL (ref 13.0–17.0)
MCH: 29.2 pg (ref 26.0–34.0)
MCHC: 31.7 g/dL (ref 30.0–36.0)
MCV: 92 fL (ref 80.0–100.0)
Platelets: 187 10*3/uL (ref 150–400)
RBC: 5 MIL/uL (ref 4.22–5.81)
RDW: 12.9 % (ref 11.5–15.5)
WBC: 6.9 10*3/uL (ref 4.0–10.5)
nRBC: 0 % (ref 0.0–0.2)

## 2023-05-26 LAB — URINALYSIS, ROUTINE W REFLEX MICROSCOPIC
Bacteria, UA: NONE SEEN
Bilirubin Urine: NEGATIVE
Glucose, UA: NEGATIVE mg/dL
Hgb urine dipstick: NEGATIVE
Ketones, ur: NEGATIVE mg/dL
Leukocytes,Ua: NEGATIVE
Nitrite: NEGATIVE
Protein, ur: NEGATIVE mg/dL
Specific Gravity, Urine: 1.01 (ref 1.005–1.030)
pH: 6 (ref 5.0–8.0)

## 2023-05-26 LAB — HEPATITIS PANEL, ACUTE
HCV Ab: NONREACTIVE
Hep A IgM: NONREACTIVE
Hep B C IgM: NONREACTIVE
Hepatitis B Surface Ag: NONREACTIVE

## 2023-05-26 LAB — ACETAMINOPHEN LEVEL: Acetaminophen (Tylenol), Serum: <10 ug/mL — ABNORMAL LOW (ref 10–30)

## 2023-05-26 LAB — COMPREHENSIVE METABOLIC PANEL
ALT: 395 U/L — ABNORMAL HIGH (ref 0–44)
AST: 371 U/L — ABNORMAL HIGH (ref 15–41)
Albumin: 3.3 g/dL — ABNORMAL LOW (ref 3.5–5.0)
Alkaline Phosphatase: 125 U/L (ref 38–126)
Anion gap: 11 (ref 5–15)
BUN: 10 mg/dL (ref 8–23)
CO2: 24 mmol/L (ref 22–32)
Calcium: 8.4 mg/dL — ABNORMAL LOW (ref 8.9–10.3)
Chloride: 101 mmol/L (ref 98–111)
Creatinine, Ser: 0.83 mg/dL (ref 0.61–1.24)
GFR, Estimated: >60 mL/min (ref >60)
Glucose, Bld: 97 mg/dL (ref 70–99)
Potassium: 3.3 mmol/L — ABNORMAL LOW (ref 3.5–5.1)
Sodium: 136 mmol/L (ref 135–145)
Total Bilirubin: 1.6 mg/dL — ABNORMAL HIGH (ref 0.3–1.2)
Total Protein: 6.9 g/dL (ref 6.5–8.1)

## 2023-05-26 LAB — HIV ANTIBODY (ROUTINE TESTING W REFLEX): HIV Screen 4th Generation wRfx: NONREACTIVE

## 2023-05-26 LAB — ETHANOL: Alcohol, Ethyl (B): <10 mg/dL (ref <10)

## 2023-05-26 MED ORDER — SODIUM CHLORIDE (PF) 0.9 % IJ SOLN
INTRAMUSCULAR | Status: AC
  Filled 2023-05-26: qty 50

## 2023-05-26 MED ORDER — ENOXAPARIN SODIUM 40 MG/0.4ML IJ SOSY
40.0000 mg | PREFILLED_SYRINGE | INTRAMUSCULAR | Status: DC
  Administered 2023-05-26: 40 mg via SUBCUTANEOUS
  Filled 2023-05-26: qty 0.4

## 2023-05-26 MED ORDER — ONDANSETRON HCL 4 MG PO TABS: 4.0000 mg | ORAL_TABLET | Freq: Four times a day (QID) | ORAL | Status: DC | PRN

## 2023-05-26 MED ORDER — ONDANSETRON HCL 4 MG/2ML IJ SOLN: 4.0000 mg | Freq: Four times a day (QID) | INTRAMUSCULAR | Status: DC | PRN

## 2023-05-26 MED ORDER — PANTOPRAZOLE SODIUM 40 MG IV SOLR: 40.0000 mg | Freq: Once | INTRAVENOUS | Status: DC

## 2023-05-26 MED ORDER — PANTOPRAZOLE SODIUM 40 MG IV SOLR
40.0000 mg | Freq: Once | INTRAVENOUS | Status: AC
  Administered 2023-05-26: 40 mg via INTRAVENOUS
  Filled 2023-05-26: qty 10

## 2023-05-26 MED ORDER — LACTATED RINGERS IV SOLN: INTRAVENOUS | Status: DC

## 2023-05-26 MED ORDER — IOHEXOL 300 MG/ML  SOLN
60.0000 mL | Freq: Once | INTRAMUSCULAR | Status: AC | PRN
  Administered 2023-05-26: 60 mL via INTRAVENOUS

## 2023-05-26 MED ORDER — PANTOPRAZOLE SODIUM 40 MG IV SOLR
40.0000 mg | Freq: Once | INTRAVENOUS | Status: AC
  Administered 2023-05-27: 40 mg via INTRAVENOUS
  Filled 2023-05-26: qty 10

## 2023-05-26 MED ORDER — ASPIRIN 81 MG PO TBEC
81.0000 mg | DELAYED_RELEASE_TABLET | Freq: Every day | ORAL | Status: DC
  Administered 2023-05-26 – 2023-05-28 (×2): 81 mg via ORAL
  Filled 2023-05-26 (×3): qty 1

## 2023-05-26 MED ORDER — MELATONIN 5 MG PO TABS
5.0000 mg | ORAL_TABLET | Freq: Every evening | ORAL | Status: DC | PRN
  Administered 2023-05-26: 5 mg via ORAL
  Filled 2023-05-26: qty 1

## 2023-05-26 MED ORDER — POTASSIUM CHLORIDE CRYS ER 20 MEQ PO TBCR
40.0000 meq | EXTENDED_RELEASE_TABLET | Freq: Once | ORAL | Status: AC
  Administered 2023-05-26: 40 meq via ORAL
  Filled 2023-05-26: qty 2

## 2023-05-26 MED ORDER — PNEUMOCOCCAL 20-VAL CONJ VACC 0.5 ML IM SUSY
0.5000 mL | PREFILLED_SYRINGE | INTRAMUSCULAR | Status: AC
  Administered 2023-05-28: 0.5 mL via INTRAMUSCULAR
  Filled 2023-05-26 (×2): qty 0.5
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
## 2023-05-26 NOTE — Assessment & Plan Note (Addendum)
Acute hepatitis like picture with N/V, epigastric pain, and transaminitis with hepatocellular injury pattern. Transaminases previously WNL on 04/02/23 ([facility] system). DDx is broad and includes: Hepatotoxicity of medications, both Arava and Remicaid are known to be potentially hepatotoxic Ranae Plumber has a black box warning regarding hepatotoxicity Remicaid can reportedly cause hepatotoxicity possibly by causing patients to form autoantibodies. Acute infectious GI illness (ie hepatitis A or other) from food poisoning / [employer] meal. Pt immunosuppressed and would be at higher risk for infectious illnesses Has Cholelithiasis; however, injury pattern more c/w a hepatocellular injury pattern (as opposed to cholestatic pattern), so choledocholithiasis, while possible, seems less likely. Tylenol level is negative, as is EtOH level.   Hepatitis pnl pending But looks like he's been tested for chronic hepatitis more recently due to immunosuppressive use and previously negative: HCV neg 01/2023, HBV neg 2022. Repeat CMP in AM RUQ [place] Stop Arava and Remicaid GI consult in AM: Ill defer the autoimmune AB work up (if they even feel this is indicated), to them.

## 2023-05-26 NOTE — Plan of Care (Signed)
Pt in 6/10 pain at mid upper abdomen but declined any intervention. Educated patient. Continuous LR @125ml /hr started. No nausea or vomiting since admission   Problem: Education: Goal: Knowledge of General Education information will improve Description: Including pain rating scale, medication(s)/side effects and non-pharmacologic comfort measures Outcome: Progressing   Problem: Health Behavior/Discharge Planning: Goal: Ability to manage health-related needs will improve Outcome: Progressing   Problem: Clinical Measurements: Goal: Ability to maintain clinical measurements within normal limits will improve Outcome: Progressing Goal: Will remain free from infection Outcome: Progressing Goal: Diagnostic test results will improve Outcome: Progressing Goal: Respiratory complications will improve Outcome: Progressing Goal: Cardiovascular complication will be avoided Outcome: Progressing   Problem: Activity: Goal: Risk for activity intolerance will decrease Outcome: Progressing   Problem: Nutrition: Goal: Adequate nutrition will be maintained Outcome: Progressing   Problem: Coping: Goal: Level of anxiety will decrease Outcome: Progressing   Problem: Elimination: Goal: Will not experience complications related to bowel motility Outcome: Progressing Goal: Will not experience complications related to urinary retention Outcome: Progressing   Problem: Pain Managment: Goal: General experience of comfort will improve Outcome: Progressing   Problem: Safety: Goal: Ability to remain free from injury will improve Outcome: Progressing   Problem: Skin Integrity: Goal: Risk for impaired skin integrity will decrease Outcome: Progressing

## 2023-05-26 NOTE — Assessment & Plan Note (Signed)
Holding [facility] as noted above.

## 2023-05-26 NOTE — Progress Notes (Signed)
[facility] PLAN OF CARE NOTE Patient: Jimmy Peters FAO:[id number]   PCP: System, Provider Not In DOB: [birth year]   DOA: 05/25/2023   DOS: 05/26/2023    Patient was admitted by my colleague earlier on 05/26/2023. I have reviewed the H&P as well as assessment and plan and agree with the same. Important changes in the plan are listed below.  Plan of care: Principal Problem:   Transaminitis Active Problems:   Nausea and vomiting   Epigastric pain   HTN (hypertension)   RA (rheumatoid arthritis) (HCC)   [facility] consulted.  MRCP ordered.  Potassium replaced.   Level of care: Med-Surg   Author: Lynden Oxford, MD  [facility] 05/26/2023 8:16 AM   If 7PM-7AM, please contact night-coverage at [email or web address]

## 2023-05-26 NOTE — Assessment & Plan Note (Addendum)
NPO PRN zofran IVF
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
## 2023-05-26 NOTE — Plan of Care (Signed)
  Problem: Clinical Measurements: Goal: Ability to maintain clinical measurements within normal limits will improve Outcome: Progressing Goal: Will remain free from infection Outcome: Progressing   Problem: Nutrition: Goal: Adequate nutrition will be maintained Outcome: Progressing   Problem: Elimination: Goal: Will not experience complications related to bowel motility Outcome: Progressing Goal: Will not experience complications related to urinary retention Outcome: Progressing   Problem: Pain Managment: Goal: General experience of comfort will improve Outcome: Progressing   Problem: Safety: Goal: Ability to remain free from injury will improve Outcome: Progressing   Problem: Skin Integrity: Goal: Risk for impaired skin integrity will decrease Outcome: Progressing

## 2023-05-26 NOTE — Assessment & Plan Note (Signed)
No meds taken prior to arrival but does request no narcotics as he is in recovery.

## 2023-05-26 NOTE — Assessment & Plan Note (Signed)
Cont Lisinopril Hold hydrochlorothiazide for the moment.
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
## 2023-05-27 ENCOUNTER — Encounter (HOSPITAL_COMMUNITY): Payer: Self-pay | Admitting: Internal Medicine

## 2023-05-27 ENCOUNTER — Encounter (HOSPITAL_COMMUNITY)
Admission: EM | Disposition: A | Discharge status: Home / Self Care | Payer: Self-pay | Source: Home / Self Care | Attending: Internal Medicine

## 2023-05-27 ENCOUNTER — Observation Stay (HOSPITAL_COMMUNITY): Payer: Medicare HMO

## 2023-05-27 ENCOUNTER — Other Ambulatory Visit: Payer: Self-pay

## 2023-05-27 ENCOUNTER — Observation Stay (HOSPITAL_COMMUNITY): Payer: Medicare HMO | Admitting: Anesthesiology

## 2023-05-27 DIAGNOSIS — K8018 Calculus of gallbladder with other cholecystitis without obstruction: Secondary | ICD-10-CM

## 2023-05-27 DIAGNOSIS — Z79899 Other long term (current) drug therapy: Secondary | ICD-10-CM | POA: Diagnosis not present

## 2023-05-27 DIAGNOSIS — Z8249 Family history of ischemic heart disease and other diseases of the circulatory system: Secondary | ICD-10-CM | POA: Diagnosis not present

## 2023-05-27 DIAGNOSIS — R7401 Elevation of levels of liver transaminase levels: Secondary | ICD-10-CM | POA: Diagnosis not present

## 2023-05-27 DIAGNOSIS — R1013 Epigastric pain: Secondary | ICD-10-CM | POA: Diagnosis present

## 2023-05-27 DIAGNOSIS — Z7982 Long term (current) use of aspirin: Secondary | ICD-10-CM | POA: Diagnosis not present

## 2023-05-27 DIAGNOSIS — K8010 Calculus of gallbladder with chronic cholecystitis without obstruction: Secondary | ICD-10-CM | POA: Diagnosis present

## 2023-05-27 DIAGNOSIS — E785 Hyperlipidemia, unspecified: Secondary | ICD-10-CM | POA: Diagnosis present

## 2023-05-27 DIAGNOSIS — D849 Immunodeficiency, unspecified: Secondary | ICD-10-CM | POA: Diagnosis present

## 2023-05-27 DIAGNOSIS — Z23 Encounter for immunization: Secondary | ICD-10-CM | POA: Diagnosis present

## 2023-05-27 DIAGNOSIS — I10 Essential (primary) hypertension: Secondary | ICD-10-CM | POA: Diagnosis present

## 2023-05-27 DIAGNOSIS — M069 Rheumatoid arthritis, unspecified: Secondary | ICD-10-CM | POA: Diagnosis present

## 2023-05-27 HISTORY — PX: CHOLECYSTECTOMY: SHX55

## 2023-05-27 LAB — COMPREHENSIVE METABOLIC PANEL
ALT: 256 U/L — ABNORMAL HIGH (ref 0–44)
AST: 131 U/L — ABNORMAL HIGH (ref 15–41)
Albumin: 3 g/dL — ABNORMAL LOW (ref 3.5–5.0)
Alkaline Phosphatase: 114 U/L (ref 38–126)
Anion gap: 6 (ref 5–15)
BUN: 9 mg/dL (ref 8–23)
CO2: 27 mmol/L (ref 22–32)
Calcium: 8.3 mg/dL — ABNORMAL LOW (ref 8.9–10.3)
Chloride: 104 mmol/L (ref 98–111)
Creatinine, Ser: 0.85 mg/dL (ref 0.61–1.24)
GFR, Estimated: >60 mL/min (ref >60)
Glucose, Bld: 83 mg/dL (ref 70–99)
Potassium: 3.9 mmol/L (ref 3.5–5.1)
Sodium: 137 mmol/L (ref 135–145)
Total Bilirubin: 0.7 mg/dL (ref 0.3–1.2)
Total Protein: 6.3 g/dL — ABNORMAL LOW (ref 6.5–8.1)

## 2023-05-27 LAB — CBC
HCT: 43.8 % (ref 39.0–52.0)
Hemoglobin: 13.6 g/dL (ref 13.0–17.0)
MCH: 29.2 pg (ref 26.0–34.0)
MCHC: 31.1 g/dL (ref 30.0–36.0)
MCV: 94.2 fL (ref 80.0–100.0)
Platelets: 190 10*3/uL (ref 150–400)
RBC: 4.65 MIL/uL (ref 4.22–5.81)
RDW: 12.8 % (ref 11.5–15.5)
WBC: 5.5 10*3/uL (ref 4.0–10.5)
nRBC: 0 % (ref 0.0–0.2)

## 2023-05-27 SURGERY — LAPAROSCOPIC CHOLECYSTECTOMY WITH INTRAOPERATIVE CHOLANGIOGRAM
Anesthesia: General

## 2023-05-27 MED ORDER — KETOROLAC TROMETHAMINE 30 MG/ML IJ SOLN: 30.0000 mg | Freq: Three times a day (TID) | INTRAMUSCULAR | Status: DC | PRN

## 2023-05-27 MED ORDER — LIDOCAINE HCL (CARDIAC) PF 100 MG/5ML IV SOSY
PREFILLED_SYRINGE | INTRAVENOUS | Status: DC | PRN
  Administered 2023-05-27: 20 mg via INTRAVENOUS

## 2023-05-27 MED ORDER — FENTANYL CITRATE (PF) 250 MCG/5ML IJ SOLN
INTRAMUSCULAR | Status: AC
  Filled 2023-05-27: qty 5

## 2023-05-27 MED ORDER — DEXAMETHASONE SODIUM PHOSPHATE 10 MG/ML IJ SOLN
INTRAMUSCULAR | Status: AC
  Filled 2023-05-27: qty 1

## 2023-05-27 MED ORDER — GLYCOPYRROLATE 0.2 MG/ML IJ SOLN
INTRAMUSCULAR | Status: DC | PRN
  Administered 2023-05-27 (×2): .1 mg via INTRAVENOUS

## 2023-05-27 MED ORDER — BUPIVACAINE-EPINEPHRINE 0.25% -1:200000 IJ SOLN
INTRAMUSCULAR | Status: DC | PRN
  Administered 2023-05-27: 11 mL

## 2023-05-27 MED ORDER — OXYCODONE HCL 5 MG PO TABS: 5.0000 mg | ORAL_TABLET | ORAL | Status: DC | PRN

## 2023-05-27 MED ORDER — CHLORHEXIDINE GLUCONATE CLOTH 2 % EX PADS
6.0000 | MEDICATED_PAD | Freq: Once | CUTANEOUS | Status: DC
  Administered 2023-05-27: 6 via TOPICAL

## 2023-05-27 MED ORDER — DEXAMETHASONE SODIUM PHOSPHATE 4 MG/ML IJ SOLN
INTRAMUSCULAR | Status: DC | PRN
  Administered 2023-05-27: 4 mg via INTRAVENOUS

## 2023-05-27 MED ORDER — LACTATED RINGERS IV SOLN: INTRAVENOUS | Status: DC

## 2023-05-27 MED ORDER — 0.9 % SODIUM CHLORIDE (POUR BTL) OPTIME
TOPICAL | Status: DC | PRN
  Administered 2023-05-27: 1000 mL

## 2023-05-27 MED ORDER — ROCURONIUM BROMIDE 100 MG/10ML IV SOLN
INTRAVENOUS | Status: DC | PRN
  Administered 2023-05-27: 20 mg via INTRAVENOUS
  Administered 2023-05-27: 50 mg via INTRAVENOUS

## 2023-05-27 MED ORDER — FENTANYL CITRATE (PF) 100 MCG/2ML IJ SOLN
INTRAMUSCULAR | Status: DC | PRN
  Administered 2023-05-27: 50 ug via INTRAVENOUS
  Administered 2023-05-27: 150 ug via INTRAVENOUS

## 2023-05-27 MED ORDER — ONDANSETRON HCL 4 MG/2ML IJ SOLN
INTRAMUSCULAR | Status: AC
  Filled 2023-05-27: qty 2

## 2023-05-27 MED ORDER — SODIUM CHLORIDE 0.9 % IV SOLN: 2.0000 g | INTRAVENOUS | Status: DC

## 2023-05-27 MED ORDER — PROMETHAZINE HCL 25 MG/ML IJ SOLN: 6.2500 mg | INTRAMUSCULAR | Status: DC | PRN

## 2023-05-27 MED ORDER — KETOROLAC TROMETHAMINE 30 MG/ML IJ SOLN
INTRAMUSCULAR | Status: AC
  Filled 2023-05-27: qty 1

## 2023-05-27 MED ORDER — SUGAMMADEX SODIUM 200 MG/2ML IV SOLN
INTRAVENOUS | Status: DC | PRN
Start: 2023-05-27 — End: 2023-05-27
  Administered 2023-05-27: 400 mg via INTRAVENOUS

## 2023-05-27 MED ORDER — PROPOFOL 10 MG/ML IV BOLUS
INTRAVENOUS | Status: DC | PRN
  Administered 2023-05-27: 200 mg via INTRAVENOUS

## 2023-05-27 MED ORDER — ROCURONIUM BROMIDE 10 MG/ML (PF) SYRINGE
PREFILLED_SYRINGE | INTRAVENOUS | Status: AC
  Filled 2023-05-27: qty 10

## 2023-05-27 MED ORDER — SODIUM CHLORIDE 0.9 % IV SOLN
2.0000 g | INTRAVENOUS | Status: AC
  Administered 2023-05-27: 2 g via INTRAVENOUS
  Filled 2023-05-27: qty 20

## 2023-05-27 MED ORDER — BUPIVACAINE-EPINEPHRINE 0.25% -1:200000 IJ SOLN
INTRAMUSCULAR | Status: AC
  Filled 2023-05-27: qty 1

## 2023-05-27 MED ORDER — DEXMEDETOMIDINE HCL IN NACL 400 MCG/100ML IV SOLN
INTRAVENOUS | Status: DC | PRN
Start: 2023-05-27 — End: 2023-05-27
  Administered 2023-05-27: 12 ug via INTRAVENOUS

## 2023-05-27 MED ORDER — HYDROMORPHONE HCL 1 MG/ML IJ SOLN: 0.2500 mg | INTRAMUSCULAR | Status: DC | PRN

## 2023-05-27 MED ORDER — GLYCOPYRROLATE 0.2 MG/ML IJ SOLN
INTRAMUSCULAR | Status: AC
  Filled 2023-05-27: qty 1

## 2023-05-27 MED ORDER — ACETAMINOPHEN 500 MG PO TABS
1000.0000 mg | ORAL_TABLET | Freq: Four times a day (QID) | ORAL | Status: DC
  Administered 2023-05-27 – 2023-05-28 (×3): 1000 mg via ORAL
  Filled 2023-05-27 (×3): qty 2

## 2023-05-27 MED ORDER — HEMOSTATIC AGENTS (NO CHARGE) OPTIME
TOPICAL | Status: DC | PRN
  Administered 2023-05-27: 1 via TOPICAL

## 2023-05-27 MED ORDER — OXYCODONE HCL 5 MG/5ML PO SOLN: 5.0000 mg | Freq: Once | ORAL | Status: DC | PRN

## 2023-05-27 MED ORDER — PHENYLEPHRINE HCL (PRESSORS) 10 MG/ML IV SOLN
INTRAVENOUS | Status: DC | PRN
  Administered 2023-05-27: 80 ug via INTRAVENOUS

## 2023-05-27 MED ORDER — MIDAZOLAM HCL 2 MG/2ML IJ SOLN
INTRAMUSCULAR | Status: AC
  Filled 2023-05-27: qty 2

## 2023-05-27 MED ORDER — CHLORHEXIDINE GLUCONATE 0.12 % MT SOLN
15.0000 mL | Freq: Once | OROMUCOSAL | Status: AC
  Administered 2023-05-27: 15 mL via OROMUCOSAL

## 2023-05-27 MED ORDER — MORPHINE SULFATE (PF) 2 MG/ML IV SOLN: 2.0000 mg | INTRAVENOUS | Status: DC | PRN

## 2023-05-27 MED ORDER — MEPERIDINE HCL 50 MG/ML IJ SOLN: 6.2500 mg | INTRAMUSCULAR | Status: DC | PRN

## 2023-05-27 MED ORDER — KETOROLAC TROMETHAMINE 15 MG/ML IJ SOLN
INTRAMUSCULAR | Status: DC | PRN
  Administered 2023-05-27: 30 mg via INTRAVENOUS

## 2023-05-27 MED ORDER — MORPHINE SULFATE (PF) 2 MG/ML IV SOLN: 1.0000 mg | INTRAVENOUS | Status: DC | PRN

## 2023-05-27 MED ORDER — ONDANSETRON HCL 4 MG/2ML IJ SOLN
INTRAMUSCULAR | Status: DC | PRN
  Administered 2023-05-27: 4 mg via INTRAVENOUS

## 2023-05-27 MED ORDER — SUCCINYLCHOLINE CHLORIDE 200 MG/10ML IV SOSY
PREFILLED_SYRINGE | INTRAVENOUS | Status: AC
  Filled 2023-05-27: qty 10

## 2023-05-27 MED ORDER — ENOXAPARIN SODIUM 40 MG/0.4ML IJ SOSY
40.0000 mg | PREFILLED_SYRINGE | INTRAMUSCULAR | Status: DC
  Filled 2023-05-27: qty 0.4

## 2023-05-27 MED ORDER — MIDAZOLAM HCL 2 MG/2ML IJ SOLN: 0.5000 mg | Freq: Once | INTRAMUSCULAR | Status: DC | PRN

## 2023-05-27 MED ORDER — LIDOCAINE HCL (PF) 2 % IJ SOLN
INTRAMUSCULAR | Status: AC
  Filled 2023-05-27: qty 5

## 2023-05-27 MED ORDER — SODIUM CHLORIDE 0.9 % IV SOLN
INTRAVENOUS | Status: DC | PRN
  Administered 2023-05-27: 6 mL

## 2023-05-27 MED ORDER — KETOROLAC TROMETHAMINE 30 MG/ML IJ SOLN: 30.0000 mg | Freq: Four times a day (QID) | INTRAMUSCULAR | Status: DC | PRN

## 2023-05-27 MED ORDER — PROPOFOL 10 MG/ML IV BOLUS
INTRAVENOUS | Status: AC
  Filled 2023-05-27: qty 20

## 2023-05-27 MED ORDER — OXYCODONE HCL 5 MG PO TABS: 5.0000 mg | ORAL_TABLET | Freq: Once | ORAL | Status: DC | PRN

## 2023-05-27 SURGICAL SUPPLY — 42 items
APL PRP STRL LF DISP 70% ISPRP (MISCELLANEOUS) ×1
APL SKNCLS STERI-STRIP NONHPOA (GAUZE/BANDAGES/DRESSINGS) ×1
APPLIER CLIP ROT 10 11.4 M/L (STAPLE) ×1
APR CLP MED LRG 11.4X10 (STAPLE) ×1
BAG SPEC RTRVL 10 TROC 200 (ENDOMECHANICALS) ×1
BENZOIN TINCTURE PRP APPL 2/3 (GAUZE/BANDAGES/DRESSINGS) ×1 IMPLANT
CABLE HIGH FREQUENCY MONO STRZ (ELECTRODE) ×1 IMPLANT
CHLORAPREP W/TINT 26 (MISCELLANEOUS) ×1 IMPLANT
CLIP APPLIE ROT 10 11.4 M/L (STAPLE) ×1 IMPLANT
COVER MAYO STAND XLG (MISCELLANEOUS) ×1 IMPLANT
COVER SURGICAL LIGHT HANDLE (MISCELLANEOUS) ×1 IMPLANT
DRAPE C-ARM 42X120 X-RAY (DRAPES) ×1 IMPLANT
DRSG TEGADERM 2-3/8X2-3/4 SM (GAUZE/BANDAGES/DRESSINGS) ×3 IMPLANT
DRSG TEGADERM 4X4.75 (GAUZE/BANDAGES/DRESSINGS) ×1 IMPLANT
ELECT REM PT RETURN 15FT ADLT (MISCELLANEOUS) ×1 IMPLANT
GAUZE SPONGE 2X2 8PLY STRL LF (GAUZE/BANDAGES/DRESSINGS) IMPLANT
GLOVE BIO SURGEON STRL SZ7 (GLOVE) ×1 IMPLANT
GLOVE BIOGEL PI IND STRL 7.5 (GLOVE) ×1 IMPLANT
GOWN STRL REUS W/ TWL LRG LVL3 (GOWN DISPOSABLE) ×1 IMPLANT
GOWN STRL REUS W/TWL LRG LVL3 (GOWN DISPOSABLE) ×1
HEMOSTAT SNOW SURGICEL 2X4 (HEMOSTASIS) IMPLANT
IRRIG SUCT STRYKERFLOW 2 WTIP (MISCELLANEOUS) ×1
IRRIGATION SUCT STRKRFLW 2 WTP (MISCELLANEOUS) ×1 IMPLANT
KIT BASIN OR (CUSTOM PROCEDURE TRAY) ×1 IMPLANT
KIT TURNOVER KIT A (KITS) IMPLANT
NS IRRIG 1000ML POUR BTL (IV SOLUTION) ×1 IMPLANT
POUCH RETRIEVAL ECOSAC 10 (ENDOMECHANICALS) IMPLANT
SCISSORS LAP 5X35 DISP (ENDOMECHANICALS) ×1 IMPLANT
SET CHOLANGIOGRAPH MIX (MISCELLANEOUS) ×1 IMPLANT
SET TUBE SMOKE EVAC HIGH FLOW (TUBING) ×1 IMPLANT
SLEEVE Z-THREAD 5X100MM (TROCAR) ×1 IMPLANT
SPIKE FLUID TRANSFER (MISCELLANEOUS) ×1 IMPLANT
STRIP CLOSURE SKIN 1/2X4 (GAUZE/BANDAGES/DRESSINGS) ×1 IMPLANT
SUT MNCRL AB 4-0 PS2 18 (SUTURE) ×1 IMPLANT
SYS BAG RETRIEVAL 10MM (BASKET)
SYSTEM BAG RETRIEVAL 10MM (BASKET) IMPLANT
TOWEL OR 17X26 10 PK STRL BLUE (TOWEL DISPOSABLE) ×1 IMPLANT
TOWEL OR NON WOVEN STRL DISP B (DISPOSABLE) ×1 IMPLANT
TRAY LAPAROSCOPIC (CUSTOM PROCEDURE TRAY) ×1 IMPLANT
TROCAR 11X100 Z THREAD (TROCAR) ×1 IMPLANT
TROCAR BALLN 12MMX100 BLUNT (TROCAR) ×1 IMPLANT
TROCAR Z-THREAD OPTICAL 5X100M (TROCAR) ×1 IMPLANT

## 2023-05-27 NOTE — Op Note (Signed)
Laparoscopic Cholecystectomy with IOC Procedure Note  Indications: Jimmy Peters is a 67 y.o. male PMH RA, HTN, HLD who presented to the ED 8/20 with chief complaint abdominal pain. States that his first episode was 2 days ago after eating [employer]. He had severe, generalized abdominal pain. He went to urgent care and was advised to go to the ED, but his symptoms resolved so he went home. States that yesterday he was eating a sloppy joe and later developed severe epigastric abdominal pain associated with nausea. He vomited upon presentation to the ED.  ED work up included u/s which showed gallbladder sludge and stones with no evidence of acute cholecystitis; CBD at the upper limits of normal, but no intrahepatic biliary ductal dilatation to strongly suggest acute duct obstruction. MRCP completed and negative for filling defects. LFTs initially elevated and have been down trending.  GI has seen with no plans for ERCP, suspect the patient may have passed a stone. General surgery asked to see for consideration of cholecystectomy.    Pre-operative Diagnosis: Calculus of gallbladder with other cholecystitis, without mention of obstruction  Post-operative Diagnosis: Same  Surgeon: Wynona Luna   Assistants: Barnetta Chapel, PA-C  Anesthesia: General endotracheal anesthesia  ASA Class: 2  Procedure Details  The patient was seen again in the Holding Room. The risks, benefits, complications, treatment options, and expected outcomes were discussed with the patient. The possibilities of reaction to medication, pulmonary aspiration, perforation of viscus, bleeding, recurrent infection, finding a normal gallbladder, the need for additional procedures, failure to diagnose a condition, the possible need to convert to an open procedure, and creating a complication requiring transfusion or operation were discussed with the patient. The likelihood of improving the patient's symptoms with return to  their baseline status is good.  The patient and/or family concurred with the proposed plan, giving informed consent. The site of surgery properly noted. The patient was taken to Operating Room, identified as [name] and the procedure verified as Laparoscopic Cholecystectomy with Intraoperative Cholangiogram. A Time Out was held and the above information confirmed.  Prior to the induction of general anesthesia, antibiotic prophylaxis was administered. General endotracheal anesthesia was then administered and tolerated well. After the induction, the abdomen was prepped with Chloraprep and draped in the sterile fashion. The patient was positioned in the supine position.  Local anesthetic agent was injected into the skin below the umbilicus and an incision made. We dissected down to the abdominal fascia with blunt dissection.  The fascia was incised vertically and we entered the peritoneal cavity bluntly.  A pursestring suture of 0-Vicryl was placed around the fascial opening.  The Hasson cannula was inserted and secured with the stay suture.  Pneumoperitoneum was then created with CO2 and tolerated well without any adverse changes in the patient's vital signs. An 11-mm port was placed in the subxiphoid position.  Two 5-mm ports were placed in the right upper quadrant. All skin incisions were infiltrated with a local anesthetic agent before making the incision and placing the trocars.   We positioned the patient in reverse Trendelenburg, tilted slightly to the patient's left.  The gallbladder was identified, the fundus grasped and retracted cephalad. The gallbladder wall is mildly thickened, but there are no signs of acute cholecystitis.  Adhesions were lysed bluntly and with the electrocautery where indicated, taking care not to injure any adjacent organs or viscus. The infundibulum was grasped and retracted laterally, exposing the peritoneum overlying the triangle of Calot. This was then  divided  and exposed in a blunt fashion. A critical view of the cystic duct and cystic artery was obtained.  The cystic duct was clearly identified and bluntly dissected circumferentially. The cystic duct was ligated with a clip distally.   An incision was made in the cystic duct and the [facility] cholangiogram catheter introduced. The catheter was secured using a clip. A cholangiogram was then obtained which showed good visualization of the distal and proximal biliary tree with no sign of filling defects or obstruction.  Contrast flowed easily into the duodenum. The catheter was then removed.   The cystic duct was then ligated with clips and divided. The cystic artery was identified, dissected free, ligated with clips and divided as well.   The gallbladder was dissected from the liver bed in retrograde fashion with the electrocautery. The gallbladder was removed and placed in an Eco sac. The liver bed was irrigated and inspected. Hemostasis was achieved with the electrocautery and Surgicel SNOW. Copious irrigation was utilized and was repeatedly aspirated until clear.  The gallbladder and Eco sac were then removed through the umbilical port site.  The pursestring suture was used to close the umbilical fascia.    We again inspected the right upper quadrant for hemostasis.  Pneumoperitoneum was released as we removed the trocars.  4-0 Monocryl was used to close the skin.   Benzoin, steri-strips, and clean dressings were applied. The patient was then extubated and brought to the recovery room in stable condition. Instrument, sponge, and needle counts were correct at closure and at the conclusion of the case.   Findings: Cholecystitis with Cholelithiasis  Estimated Blood Loss: less than 50 mL         Drains: none         Specimens: Gallbladder           Complications: None; patient tolerated the procedure well.         Disposition: PACU - hemodynamically stable.         Condition: stable  Wilmon Arms. Corliss Skains, MD,  [facility] Surgery  General Surgery   05/27/2023 1:53 PM
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
## 2023-05-27 NOTE — Anesthesia Procedure Notes (Signed)
Procedure Name: Intubation Date/Time: 05/27/2023 12:57 PM  Performed by: Ahmed Prima, CRNAPre-anesthesia Checklist: Patient identified, Emergency Drugs available, Suction available and Patient being monitored Patient Re-evaluated:Patient Re-evaluated prior to induction Oxygen Delivery Method: Circle system utilized Preoxygenation: Pre-oxygenation with 100% oxygen Induction Type: IV induction Ventilation: Mask ventilation without difficulty Laryngoscope Size: Miller and 2 Grade View: Grade II Tube type: Oral Tube size: 7.5 mm Number of attempts: 1 Airway Equipment and Method: Stylet and Oral airway Placement Confirmation: ETT inserted through vocal cords under direct vision, positive ETCO2 and breath sounds checked- equal and bilateral Secured at: 23 cm Tube secured with: Tape Dental Injury: Teeth and Oropharynx as per pre-operative assessment

## 2023-05-27 NOTE — Anesthesia Postprocedure Evaluation (Signed)
Anesthesia Post Note  Patient: Jimmy Peters  Procedure(s) Performed: LAPAROSCOPIC CHOLECYSTECTOMY WITH INTRAOPERATIVE CHOLANGIOGRAM     Patient location during evaluation: PACU Anesthesia Type: General Level of consciousness: awake and alert, patient cooperative and oriented Pain management: pain level controlled Vital Signs Assessment: post-procedure vital signs reviewed and stable Respiratory status: spontaneous breathing, nonlabored ventilation and respiratory function stable Cardiovascular status: blood pressure returned to baseline and stable Postop Assessment: no apparent nausea or vomiting Anesthetic complications: no   No notable events documented.  Last Vitals:  Vitals:   05/27/23 1430 05/27/23 1445  BP: (!) 139/90 (!) 147/94  Pulse: (!) 59 73  Resp: 19 19  Temp:    SpO2: 94% 92%    Last Pain:  Vitals:   05/27/23 1445  TempSrc:   PainSc: 0-No pain                 Davan Nawabi,E. Nicki Furlan

## 2023-05-27 NOTE — Hospital Course (Signed)
Brief hospital course: PMH of rheumatoid arthritis presented to the hospital with complaints of abdominal pain after eating fast food with nausea and vomiting. Found to have elevated LFTs.  Concern for biliary colic. GI was consulted, MRCP performed which was negative for any CBD stone. Underwent lap chole on 8/22. Monitor overnight. Assessment and Plan: Biliary colic. Elevated fever Discussed with GI as well as general surgery. LFTs improving. MRCP negative for any CBD stone. Hepatitis negative. Underwent lap chole. Monitor postop course.  Nausea and vomiting as well as epigastric pain appears to be improving.  Rheumatoid arthritis. On Arava.  Currently on hold.  HTN. Blood pressure medication on hold as blood pressure is soft. Will monitor.

## 2023-05-27 NOTE — Progress Notes (Signed)
[facility] Progress Note Patient: Jimmy Peters LKG:[id number] DOB: [birth year] DOA: 05/25/2023  DOS: the patient was seen and examined on 05/27/2023  Brief hospital course: PMH of rheumatoid arthritis presented to the hospital with complaints of abdominal pain after eating fast food with nausea and vomiting. Found to have elevated LFTs.  Concern for biliary colic. GI was consulted, MRCP performed which was negative for any CBD stone. Underwent lap chole on 8/22. Monitor overnight. Assessment and Plan: Biliary colic. Elevated fever Discussed with GI as well as general surgery. LFTs improving. MRCP negative for any CBD stone. Hepatitis negative. Underwent lap chole. Monitor postop course.  Nausea and vomiting as well as epigastric pain appears to be improving.  Rheumatoid arthritis. On Arava.  Currently on hold.  HTN. Blood pressure medication on hold as blood pressure is soft. Will monitor.   Subjective: No nausea or vomiting.  Abdominal pain still present.  Passing gas.  Physical Exam: General: in Mild distress, No Rash Cardiovascular: S1 and S2 Present, No Murmur Respiratory: Good respiratory effort, Bilateral Air entry present. No Crackles, No wheezes Abdomen: Bowel Sound present, mild epigastric tenderness Extremities: No edema Neuro: Alert and oriented x3, no new focal deficit  Data Reviewed: I have Reviewed nursing notes, Vitals, and Lab results. Since last encounter, pertinent lab results CBC and BMP   . I have ordered test including CBC and CMP  . I have discussed pt's care plan and test results with GI and general surgery  .   Disposition: Status is: Inpatient Remains inpatient appropriate because: Monitor postop recovery  enoxaparin (LOVENOX) injection 40 mg Start: 05/28/23 1400   Family Communication: No one at bedside Level of care: Med-Surg   Vitals:   05/27/23 1406 05/27/23 1415 05/27/23 1430 05/27/23 1445  BP: 125/77 127/82 (!) 139/90  (!) 147/94  Pulse: 63 (!) 54 (!) 59 73  Resp: 16 14 19 19   Temp:  97.6 F (36.4 C)    TempSrc:      SpO2: 100% 100% 94% 92%  Weight:      Height:         Author: Lynden Oxford, MD 05/27/2023 5:15 PM  Please look on [email or web address] to find out who is on call.

## 2023-05-27 NOTE — Plan of Care (Signed)

## 2023-05-27 NOTE — Anesthesia Preprocedure Evaluation (Addendum)
Anesthesia Evaluation  Patient identified by MRN, date of birth, ID band Patient awake    Reviewed: Allergy & Precautions, NPO status , Patient's Chart, lab work & pertinent test results  History of Anesthesia Complications Negative for: history of anesthetic complications  Airway Mallampati: II  TM Distance: >3 FB Neck ROM: Full    Dental  (+) Edentulous Upper, Poor Dentition, Missing, Loose, Dental Advisory Given, Chipped   Pulmonary neg pulmonary ROS   breath sounds clear to auscultation       Cardiovascular hypertension, Pt. on medications (-) angina  Rhythm:Regular Rate:Normal     Neuro/Psych negative neurological ROS     GI/Hepatic Elevated LFTs with acute cholecystitis Abd pain with acute cholecystitis   Endo/Other  negative endocrine ROS    Renal/GU negative Renal ROS     Musculoskeletal  (+) Arthritis , Rheumatoid disorders,    Abdominal   Peds  Hematology negative hematology ROS (+)   Anesthesia Other Findings   Reproductive/Obstetrics                             Anesthesia Physical Anesthesia Plan  ASA: 2  Anesthesia Plan: General   Post-op Pain Management: Toradol IV (intra-op)*   Induction: Intravenous and Rapid sequence  PONV Risk Score and Plan: 2 and Ondansetron and Dexamethasone  Airway Management Planned: Oral ETT  Additional Equipment: None  Intra-op Plan:   Post-operative Plan: Extubation in OR  Informed Consent: I have reviewed the patients History and Physical, chart, labs and discussed the procedure including the risks, benefits and alternatives for the proposed anesthesia with the patient or authorized representative who has indicated his/her understanding and acceptance.     Dental advisory given  Plan Discussed with: CRNA and Surgeon  Anesthesia Plan Comments: (Pt requests no narcotics: counseled he will need pain meds, will dose while he is  asleep to treat prophyllactically, and include Toradol)        Anesthesia Quick Evaluation

## 2023-05-27 NOTE — Transfer of Care (Signed)
Immediate Anesthesia Transfer of Care Note  Patient: Jimmy Peters  Procedure(s) Performed: LAPAROSCOPIC CHOLECYSTECTOMY WITH INTRAOPERATIVE CHOLANGIOGRAM  Patient Location: PACU  Anesthesia Type:General  Level of Consciousness: drowsy  Airway & Oxygen Therapy: Patient Spontanous Breathing and Patient connected to face mask oxygen  Post-op Assessment: Report given to RN and Post -op Vital signs reviewed and stable  Post vital signs: Reviewed and stable  Last Vitals:  Vitals Value Taken Time  BP 125/77 05/27/23 1406  Temp    Pulse 56 05/27/23 1407  Resp 13 05/27/23 1407  SpO2 100 % 05/27/23 1407  Vitals shown include unfiled device data.  Last Pain:  Vitals:   05/27/23 1156  TempSrc:   PainSc: 0-No pain         Complications: No notable events documented.

## 2023-05-27 NOTE — Discharge Instructions (Signed)

## 2023-05-27 NOTE — Progress Notes (Signed)
MRCP no choledocholithiasis.  Surgery consulted for cholecystectomy.  [facility] will sign-off; please call with questions.

## 2023-05-28 ENCOUNTER — Encounter (HOSPITAL_COMMUNITY): Payer: Self-pay | Admitting: Surgery

## 2023-05-28 LAB — SURGICAL PATHOLOGY

## 2023-05-28 MED ORDER — ACETAMINOPHEN 500 MG PO TABS: 1000.0000 mg | ORAL_TABLET | Freq: Four times a day (QID) | ORAL | Status: AC | PRN

## 2023-05-28 NOTE — Discharge Summary (Signed)
[facility] Discharge Summary   Patient ID: Jimmy Peters MRN: [id number] DOB/AGE: 1955-12-23 67 y.o.  Admit date: 05/25/2023 Discharge date: 05/28/2023  Admitting Diagnosis: Transaminitis Cholelithiasis  Discharge Diagnosis Calculus of gallbladder with other cholecystitis, without mention of obstruction   Consultants Hospitalist Gastroenterology  Imaging: DG Cholangiogram Operative  Result Date: 05/27/2023 CLINICAL DATA:  Intraoperative cholangiogram during laparoscopic cholecystectomy. EXAM: INTRAOPERATIVE CHOLANGIOGRAM FLUOROSCOPY TIME:  10 seconds (5.7 mGy) COMPARISON:  MRCP-05/26/2023; right upper quadrant abdominal ultrasound-05/26/2023; CT abdomen pelvis-05/26/2023 FINDINGS: Intraoperative cholangiographic images of the right upper abdominal quadrant during laparoscopic cholecystectomy are provided for review. Surgical clips overlie the expected location of the gallbladder fossa. Contrast injection demonstrates selective cannulation of the central aspect of the cystic duct. There is passage of contrast through the central aspect of the cystic duct with filling of a non dilated common bile duct. There is passage of contrast though the CBD and into the descending portion of the duodenum. There is minimal reflux of injected contrast into the common hepatic duct and central aspect of the non dilated intrahepatic biliary system. There are transient nonocclusive filling defects within the CBD favored to represent air bubbles though indeterminate on the present examination. IMPRESSION: Transient nonocclusive filling defects within the CBD, potentially representative of air bubbles though indeterminate on the present examination. Correlation with the operative report is advised. Electronically Signed   By: Simonne Come M.D.   On: 05/27/2023 17:16   MR ABDOMEN MRCP WO CONTRAST  Result Date: 05/26/2023 CLINICAL DATA:  Cholelithiasis.  Borderline common duct EXAM: MRI  ABDOMEN WITHOUT CONTRAST  (INCLUDING MRCP) TECHNIQUE: Multiplanar multisequence MR imaging of the abdomen was performed. Heavily T2-weighted images of the biliary and pancreatic ducts were obtained, and three-dimensional MRCP images were rendered by post processing. COMPARISON:  Ultrasound 05/26/2023.  CT 05/25/2023 FINDINGS: Lower chest: Once again there is nodular abnormal signal along the right lung base corresponding to the finding by CT. Please correlate with the prior chest CT exam there is also some wall thickening along the lower esophagus as described previously. Hepatobiliary: Preserved hepatic parenchyma. No restricted diffusion. No signal dropout out of phase imaging. No abnormal T2 signal in the parenchyma. No intrahepatic biliary ductal dilatation. The common duct proximally has a diameter of up to 7 mm but does taper towards the pancreatic head. No definite filling defect identified along the course of the common duct. Gallbladder has multiple layering stones with some sludge. No gallbladder wall thickening of note the cystic duct course is posterior to the common duct and inserts more posteromedial Pancreas: No restricted diffusion. No pancreatic atrophy. No pancreatic ductal dilatation. Spleen:  Within normal limits in size and appearance. Adrenals/Urinary Tract: No collecting system dilatation. There are some tiny Bosniak 1 and 2 renal cysts. No specific imaging follow-up. Stable adrenal glands. Stomach/Bowel: Visualized portions within the abdomen are unremarkable. Vascular/Lymphatic: No pathologically enlarged lymph nodes identified. No abdominal aortic aneurysm demonstrated. Other:  None. Musculoskeletal: Curvature of the spine. Moderate degenerative changes. IMPRESSION: Gallstones and sludge.  The gallbladder is nondilated. Prominent extrahepatic common duct but normal tapering towards the pancreatic head and no filling defects. Electronically Signed   By: Karen Kays M.D.   On: 05/26/2023  14:39   MR 3D Recon At Scanner  Result Date: 05/26/2023 CLINICAL DATA:  Cholelithiasis.  Borderline common duct EXAM: MRI ABDOMEN WITHOUT CONTRAST  (INCLUDING MRCP) TECHNIQUE: Multiplanar multisequence MR imaging of the abdomen was performed. Heavily T2-weighted images of the biliary and pancreatic ducts were obtained, and  three-dimensional MRCP images were rendered by post processing. COMPARISON:  Ultrasound 05/26/2023.  CT 05/25/2023 FINDINGS: Lower chest: Once again there is nodular abnormal signal along the right lung base corresponding to the finding by CT. Please correlate with the prior chest CT exam there is also some wall thickening along the lower esophagus as described previously. Hepatobiliary: Preserved hepatic parenchyma. No restricted diffusion. No signal dropout out of phase imaging. No abnormal T2 signal in the parenchyma. No intrahepatic biliary ductal dilatation. The common duct proximally has a diameter of up to 7 mm but does taper towards the pancreatic head. No definite filling defect identified along the course of the common duct. Gallbladder has multiple layering stones with some sludge. No gallbladder wall thickening of note the cystic duct course is posterior to the common duct and inserts more posteromedial Pancreas: No restricted diffusion. No pancreatic atrophy. No pancreatic ductal dilatation. Spleen:  Within normal limits in size and appearance. Adrenals/Urinary Tract: No collecting system dilatation. There are some tiny Bosniak 1 and 2 renal cysts. No specific imaging follow-up. Stable adrenal glands. Stomach/Bowel: Visualized portions within the abdomen are unremarkable. Vascular/Lymphatic: No pathologically enlarged lymph nodes identified. No abdominal aortic aneurysm demonstrated. Other:  None. Musculoskeletal: Curvature of the spine. Moderate degenerative changes. IMPRESSION: Gallstones and sludge.  The gallbladder is nondilated. Prominent extrahepatic common duct but normal  tapering towards the pancreatic head and no filling defects. Electronically Signed   By: Karen Kays M.D.   On: 05/26/2023 14:39    Procedures Dr. Corliss Skains (05/27/2023) - Laparoscopic Cholecystectomy with [facility]  Hospital Course:  Jimmy Peters is a 67 y.o. male who presented to the ED 8/20 with acute onset abdominal pain and nausea.  Workup showed gallstones and elevated LFTs. MRCP negative for choledocholithiasis. LFTs were monitored and trended down, consistent with probable passing of a gallstone.  Patient was taken to the operating room for laparoscopic cholecystectomy. IOC obtained and questioned air bubbles in the common bile duct but no definite obstructing stone.  Patient tolerated procedure well and was transferred to the floor.  Diet was advanced as tolerated.  His symptoms improved. On POD1, the patient was voiding well, tolerating diet, ambulating well, pain well controlled, vital signs stable, incisions c/d/i and felt stable for discharge home.  Patient will follow up as below and knows to call with questions or concerns.     Physical Exam: General:  Alert, NAD, pleasant, comfortable Pulm: rate and effort normal Abd:  Soft, ND, appropriately tender, multiple lap incisions with cdi dressings/ most lateral dressing with small amount of dried blood  Allergies as of 05/28/2023   No Known Allergies      Medication List     TAKE these medications    acetaminophen 500 MG tablet Commonly known as: TYLENOL Take 2 tablets (1,000 mg total) by mouth every 6 (six) hours as needed for mild pain or moderate pain.   aspirin EC 81 MG tablet Take 81 mg by mouth daily.   atorvastatin 20 MG tablet Commonly known as: LIPITOR Take 20 mg by mouth every evening.   hydrochlorothiazide 25 MG tablet Commonly known as: HYDRODIURIL Take 1 tablet by mouth daily.   leflunomide 20 MG tablet Commonly known as: ARAVA Take 1 tablet by mouth daily.   lisinopril 10 MG tablet Commonly known  as: ZESTRIL Take 1 tablet by mouth daily.   sildenafil 50 MG tablet Commonly known as: VIAGRA Take 50 mg by mouth daily as needed for erectile dysfunction.  Follow-up Information     Maczis, Hedda Slade, PA-C Follow up on 06/17/2023.   Specialty: General Surgery Why: 2:30pm, Arrive 30 minutes prior to your appointment time, Please bring your insurance card and photo ID Contact information: [address] [place] [address] [facility] [place] [address] [phone]                  Signed: Franne Forts, [facility] 05/28/2023, 8:21 AM Please see Amion for pager number during day hours 7:00am-4:30pm

## 2023-05-28 NOTE — Plan of Care (Signed)

## 2023-05-28 NOTE — Progress Notes (Signed)
Pt d/c to home with self care. Transport provided by friend/family  05/28/23 1202  TOC Discharge Assessment  Final next level of care Home/Self Care  Once discharged, how will the patient get to their discharge location? Family/Friend - Partnered Transport  Has discharge transport plan been identified? Yes  Barriers to Discharge No Barriers Identified  Patient states their goals for this hospitalization and ongoing recovery are: none stated  Patient and family notified of of transfer 05/28/23

## 2024-01-06 LAB — THYROID PANEL WITH TSH
Free T4: 1.7 ng/dL
TSH: 0.56 (ref 0.41–5.90)

## 2024-01-06 LAB — HM HIV SCREENING LAB: HM HIV Screening: NEGATIVE

## 2024-01-06 LAB — PSA, TOTAL AND FREE
PSA, FREE: 0.17
PSA, Total: 0.8

## 2024-01-06 LAB — HEMOGLOBIN A1C: A1c: 5

## 2024-01-06 LAB — COMPREHENSIVE METABOLIC PANEL (CC13): EGFR: 86

## 2024-02-29 LAB — COMPREHENSIVE METABOLIC PANEL (CC13)

## 2024-04-11 ENCOUNTER — Ambulatory Visit: Payer: Self-pay | Admitting: Podiatry

## 2024-04-11 ENCOUNTER — Encounter: Payer: Self-pay | Admitting: Podiatry

## 2024-04-11 DIAGNOSIS — M79672 Pain in left foot: Secondary | ICD-10-CM

## 2024-04-11 DIAGNOSIS — B351 Tinea unguium: Secondary | ICD-10-CM | POA: Diagnosis not present

## 2024-04-11 DIAGNOSIS — M79671 Pain in right foot: Secondary | ICD-10-CM

## 2024-04-11 NOTE — Progress Notes (Signed)
 Patient presents for evaluation and treatment of tenderness and some redness around nails feet.  Tenderness around toes with walking and wearing shoes.  Physical exam:  General appearance: Alert, pleasant, and in no acute distress.  Vascular: Pedal pulses: DP 2/4 B/L, PT 1/4 B/L.  Mild to moderate edema lower legs bilaterally  Neurological:    Dermatologic:  Nails thickened, disfigured, discolored 1-5 BL with subungual debris.  Redness and hypertrophic nail folds along nail folds bilaterally but no signs of drainage or infection.  Musculoskeletal:     Diagnosis: 1. Painful onychomycotic nails 1 through 5 bilaterally. 2. Pain toes 1 through 5 bilaterally.  Plan: Debrided onychomycotic nails 1 through 5 bilaterally.  Return 3 months [facility]

## 2024-07-12 ENCOUNTER — Ambulatory Visit: Admitting: Podiatry

## 2024-07-26 ENCOUNTER — Encounter: Payer: Self-pay | Admitting: Podiatry

## 2024-07-26 ENCOUNTER — Ambulatory Visit: Admitting: Podiatry

## 2024-07-26 DIAGNOSIS — M79671 Pain in right foot: Secondary | ICD-10-CM | POA: Diagnosis not present

## 2024-07-26 DIAGNOSIS — B351 Tinea unguium: Secondary | ICD-10-CM | POA: Diagnosis not present

## 2024-07-26 DIAGNOSIS — M79672 Pain in left foot: Secondary | ICD-10-CM | POA: Diagnosis not present

## 2024-07-26 NOTE — Progress Notes (Signed)
 Patient presents for evaluation and treatment of tenderness and some redness around nails feet.  Tenderness around toes with walking and wearing shoes.  Physical exam:  General appearance: Alert, pleasant, and in no acute distress.  Vascular: Pedal pulses: DP 2/4 B/L, PT 1/4 B/L. Mild edema lower legs bilaterally  Neurologic:  Dermatologic:  Nails thickened, disfigured, discolored 1-5 BL with subungual debris.  Redness and hypertrophic nail folds along nail folds bilaterally but no signs of drainage or infection.  Musculoskeletal:     Diagnosis: 1. Painful onychomycotic nails 1 through 5 bilaterally. 2. Pain toes 1 through 5 bilaterally.  Plan: -Debrided onychomycotic nails 1 through 5 bilaterally.  Sharply debrided nails with nail clipper and reduced with a power bur.  Return 3 months [facility]

## 2024-10-18 ENCOUNTER — Ambulatory Visit

## 2024-10-18 VITALS — BP 135/92 | HR 70 | Temp 97.9°F | Resp 16 | Ht 68.0 in | Wt 174.0 lb

## 2024-10-18 DIAGNOSIS — M059 Rheumatoid arthritis with rheumatoid factor, unspecified: Secondary | ICD-10-CM

## 2024-10-18 MED ORDER — FAMOTIDINE 40 MG PO TABS: 40.0000 mg | ORAL_TABLET | Freq: Every day | ORAL | 0 refills | Status: AC

## 2024-10-18 MED ORDER — PREDNISONE 5 MG PO TABS: ORAL_TABLET | ORAL | 0 refills | Status: AC
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
## 2024-10-19 ENCOUNTER — Encounter: Payer: Self-pay | Admitting: Physician Assistant

## 2024-10-19 ENCOUNTER — Other Ambulatory Visit: Payer: Self-pay | Admitting: Pharmacist

## 2024-10-19 ENCOUNTER — Ambulatory Visit: Payer: Self-pay

## 2024-10-19 ENCOUNTER — Encounter: Payer: Self-pay | Admitting: Pharmacist

## 2024-10-19 ENCOUNTER — Encounter (HOSPITAL_COMMUNITY): Payer: Self-pay | Admitting: Rheumatology

## 2024-10-19 ENCOUNTER — Telehealth: Payer: Self-pay | Admitting: Pharmacy Technician

## 2024-10-19 DIAGNOSIS — M059 Rheumatoid arthritis with rheumatoid factor, unspecified: Secondary | ICD-10-CM | POA: Insufficient documentation

## 2024-10-19 DIAGNOSIS — Z79899 Other long term (current) drug therapy: Secondary | ICD-10-CM | POA: Insufficient documentation

## 2024-10-19 LAB — CBC WITH DIFFERENTIAL/PLATELET
Absolute Lymphocytes: 1509 {cells}/uL (ref 850–3900)
Absolute Monocytes: 432 {cells}/uL (ref 200–950)
Basophils Absolute: 61 {cells}/uL (ref 0–200)
Basophils Relative: 1.3 %
Eosinophils Absolute: 494 {cells}/uL (ref 15–500)
Eosinophils Relative: 10.5 %
HCT: 47.8 % (ref 39.4–51.1)
Hemoglobin: 15.4 g/dL (ref 13.2–17.1)
MCH: 29.5 pg (ref 27.0–33.0)
MCHC: 32.2 g/dL (ref 31.6–35.4)
MCV: 91.6 fL (ref 81.4–101.7)
MPV: 10.8 fL (ref 7.5–12.5)
Monocytes Relative: 9.2 %
Neutro Abs: 2204 {cells}/uL (ref 1500–7800)
Neutrophils Relative %: 46.9 %
Platelets: 282 Thousand/uL (ref 140–400)
RBC: 5.22 Million/uL (ref 4.20–5.80)
RDW: 11.5 % (ref 11.0–15.0)
Total Lymphocyte: 32.1 %
WBC: 4.7 Thousand/uL (ref 3.8–10.8)

## 2024-10-19 LAB — COMPLETE METABOLIC PANEL WITHOUT GFR
AG Ratio: 1.3 (calc) (ref 1.0–2.5)
ALT: 26 U/L (ref 9–46)
AST: 28 U/L (ref 10–35)
Albumin: 4.3 g/dL (ref 3.6–5.1)
Alkaline phosphatase (APISO): 71 U/L (ref 35–144)
BUN: 17 mg/dL (ref 7–25)
CO2: 30 mmol/L (ref 20–32)
Calcium: 9.5 mg/dL (ref 8.6–10.3)
Chloride: 103 mmol/L (ref 98–110)
Creat: 1.05 mg/dL (ref 0.70–1.35)
Globulin: 3.3 g/dL (ref 1.9–3.7)
Glucose, Bld: 96 mg/dL (ref 65–99)
Potassium: 4.3 mmol/L (ref 3.5–5.3)
Sodium: 141 mmol/L (ref 135–146)
Total Bilirubin: 0.7 mg/dL (ref 0.2–1.2)
Total Protein: 7.6 g/dL (ref 6.1–8.1)

## 2024-10-19 LAB — C-REACTIVE PROTEIN: CRP: <3 mg/L

## 2024-10-19 LAB — SEDIMENTATION RATE: Sed Rate: 2 mm/h (ref 0–20)

## 2024-10-19 NOTE — Addendum Note (Signed)
 Addended by: DAYNE SHERRY RAMAN on: 10/19/2024 12:21 PM   Modules accepted: Orders

## 2024-10-19 NOTE — Assessment & Plan Note (Signed)
 Synovitis and tenderness present on exam today in multiple joints. CDAI score 40. Current medication regimen is not controlling his symptoms. He has tried 2 other dMARDs without improvement. Unable to self-administer biologics. His previous rheumatologist was planning on starting him on Orencia IV. I am agreeable to this plan. He will continue to take the leflunamide until he has been approved for Orencia. Will start on a prednisone  taper to hopefully help his current flare up. Prior testing for hepatitis B & C was negative. Last TB test was 08/13/2023.  -Initiate Orencia IV infusions. -Continue leflunomide 15 mg daily until Orencia has started, then discontinue. -Start famotidine  40 mg prn for dyspepsia associated with leflunomide.  -Start prednisone  20 mg taper over 12 days.  -Today, I assessed for side effects, infections, new or worsening health conditions that may be related to the medications, and will obtain labs.  Orders:   Sedimentation rate   CBC with Differential/Platelet   COMPLETE METABOLIC PANEL WITHOUT GFR   C-reactive protein  Meds ordered this encounter  Medications   predniSONE  (DELTASONE ) 5 MG tablet    Sig: Take 4 tablets (20 mg total) by mouth daily with breakfast for 3 days, THEN 3 tablets (15 mg total) daily with breakfast for 3 days, THEN 2 tablets (10 mg total) daily with breakfast for 3 days, THEN 1 tablet (5 mg total) daily with breakfast for 3 days.    Dispense:  30 tablet    Refill:  0   famotidine  (PEPCID ) 40 MG tablet    Sig: Take 1 tablet (40 mg total) by mouth daily.    Dispense:  30 tablet    Refill:  0

## 2024-10-19 NOTE — Progress Notes (Signed)
 Referral placed for Orencia (abatacept) IV ([id number]) at [facility] [phone]) Jimmy Peters to start benefits investigation.  Diagnosis: rheumatoid arthritis (RA)  Provider: Daved Holstein, PA-C  Dose: 750mg  at Week 0, Week 2, Week 4 then every 4 weeks Premedications: acetaminophen  650mg  p.o. and diphenhydramine  25mg  p.o. Labs to be drawn with infusions: CBC/CMP every 3 months and TB gold yearly  Last dose/infusion date: new start Last Clinic Visit: 10/18/2024 Next Clinic Visit: 11/20/2024  Pertinent baseline labs: will draw TB gold with first infusion and yearly thereafter; HIV negative 01/06/2024; Hep C negative 01/08/2023; Heb B negative 10/31/19  Once benefits are processed, infusion center will contact patient to schedule.   Sherry Pennant, PharmD, MPH, BCPS, CPP Clinical Pharmacist

## 2024-10-19 NOTE — Telephone Encounter (Signed)
" °  Jimmy Peters, the patient will be scheduled as soon as possible  Auth Submission: APPROVED  Site of care: Site of care: [facility] Payer: [employer] Medicare Medication & CPT/J Code(s) submitted: Orencia (Abatacept) [id number] Diagnosis Code: M06.9 Route of submission (phone, fax, portal): phone  Phone # [phone] Fax # [phone] Auth type: Buy/Bill PB Units/visits requested: 750 mg q2 weeks x 3;  750 mg q4 weeks x 12 Reference number: [id number] Approval from: 10/19/2024 to 10/18/2025        "

## 2024-10-19 NOTE — Telephone Encounter (Signed)
 ERROR

## 2024-10-25 ENCOUNTER — Encounter: Payer: Self-pay | Admitting: Podiatry

## 2024-10-25 ENCOUNTER — Ambulatory Visit: Admitting: Podiatry

## 2024-10-25 DIAGNOSIS — M79675 Pain in left toe(s): Secondary | ICD-10-CM

## 2024-10-25 DIAGNOSIS — B351 Tinea unguium: Secondary | ICD-10-CM

## 2024-10-25 DIAGNOSIS — M79674 Pain in right toe(s): Secondary | ICD-10-CM

## 2024-10-25 NOTE — Progress Notes (Signed)
 Patient presents for evaluation and treatment of tenderness and some redness around nails feet.  Tenderness around toes with walking and wearing shoes.  Physical exam:  General appearance: Alert, pleasant, and in no acute distress.  Vascular: Pedal pulses: DP 2/4 B/L, PT 2/4 B/L. Mild edema lower legs bilaterally.  Capillary refill time immediate bilaterally  Neurologic:  Dermatologic:  Nails thickened, disfigured, discolored 1-5 BL with subungual debris.  Redness and hypertrophic nail folds along nail folds bilaterally but no signs of drainage or infection.  Musculoskeletal:     Diagnosis: 1. Painful onychomycotic nails 1 through 5 bilaterally. 2. Pain toes 1 through 5 bilaterally.  Plan: -Debrided onychomycotic nails 1 through 5 bilaterally.  Sharply debrided nails with nail clipper and reduced with a power bur.  Return 3 months RFC

## 2024-11-03 ENCOUNTER — Ambulatory Visit (INDEPENDENT_AMBULATORY_CARE_PROVIDER_SITE_OTHER): Admitting: *Deleted

## 2024-11-03 VITALS — BP 173/96 | HR 58 | Temp 98.0°F | Resp 16 | Ht 68.5 in | Wt 178.2 lb

## 2024-11-03 DIAGNOSIS — Z79899 Other long term (current) drug therapy: Secondary | ICD-10-CM | POA: Diagnosis not present

## 2024-11-03 DIAGNOSIS — M059 Rheumatoid arthritis with rheumatoid factor, unspecified: Secondary | ICD-10-CM

## 2024-11-03 MED ORDER — SODIUM CHLORIDE 0.9 % IV SOLN
750.0000 mg | Freq: Once | INTRAVENOUS | Status: AC
  Administered 2024-11-03: 750 mg via INTRAVENOUS
  Filled 2024-11-03: qty 30

## 2024-11-03 MED ORDER — ACETAMINOPHEN 325 MG PO TABS
650.0000 mg | ORAL_TABLET | Freq: Once | ORAL | Status: AC
  Administered 2024-11-03: 650 mg via ORAL
  Filled 2024-11-03: qty 2

## 2024-11-03 MED ORDER — DIPHENHYDRAMINE HCL 25 MG PO CAPS
25.0000 mg | ORAL_CAPSULE | Freq: Once | ORAL | Status: AC
  Administered 2024-11-03: 25 mg via ORAL
  Filled 2024-11-03: qty 1

## 2024-11-03 NOTE — Progress Notes (Cosign Needed Addendum)
 Diagnosis:  Rheumatoid Arthritis  Provider:  Mannam, Praveen MD  Procedure: IV Infusion  IV Type: Peripheral, IV Location: R Upper Arm  Orencia  (Abatacept ), Dose: 750 mg  Infusion Start Time: 0858 am  Infusion Stop Time: 0935 am  Post Infusion IV Care: Observation period completed and Peripheral IV Discontinued  Discharge: Condition: Good, Destination: Home . AVS Provided  Performed by:  Trudy Lamarr LABOR, RN

## 2024-11-20 ENCOUNTER — Ambulatory Visit

## 2024-12-01 ENCOUNTER — Ambulatory Visit

## 2025-01-24 ENCOUNTER — Ambulatory Visit: Admitting: Podiatry
